# Patient Record
Sex: Female | Born: 1945 | Race: White | Hispanic: No | Smoking: Former smoker
Health system: Southern US, Community
[De-identification: ages and names within clinical notes are randomized; demographics above are authoritative.]

## PROBLEM LIST (undated history)

## (undated) DIAGNOSIS — N816 Rectocele: Secondary | ICD-10-CM

## (undated) DIAGNOSIS — Z8679 Personal history of other diseases of the circulatory system: Secondary | ICD-10-CM

## (undated) DIAGNOSIS — N815 Vaginal enterocele: Secondary | ICD-10-CM

## (undated) DIAGNOSIS — F32A Depression, unspecified: Secondary | ICD-10-CM

## (undated) DIAGNOSIS — R87629 Unspecified abnormal cytological findings in specimens from vagina: Secondary | ICD-10-CM

## (undated) DIAGNOSIS — F329 Major depressive disorder, single episode, unspecified: Secondary | ICD-10-CM

## (undated) DIAGNOSIS — I10 Essential (primary) hypertension: Secondary | ICD-10-CM

## (undated) DIAGNOSIS — IMO0002 Reserved for concepts with insufficient information to code with codable children: Secondary | ICD-10-CM

## (undated) DIAGNOSIS — N959 Unspecified menopausal and perimenopausal disorder: Secondary | ICD-10-CM

## (undated) DIAGNOSIS — Z87442 Personal history of urinary calculi: Secondary | ICD-10-CM

## (undated) DIAGNOSIS — M1990 Unspecified osteoarthritis, unspecified site: Secondary | ICD-10-CM

## (undated) DIAGNOSIS — G4700 Insomnia, unspecified: Secondary | ICD-10-CM

## (undated) HISTORY — DX: Rectocele: N81.6

## (undated) HISTORY — DX: Vaginal enterocele: N81.5

## (undated) HISTORY — DX: Reserved for concepts with insufficient information to code with codable children: IMO0002

## (undated) HISTORY — DX: Unspecified osteoarthritis, unspecified site: M19.90

## (undated) HISTORY — DX: Major depressive disorder, single episode, unspecified: F32.9

## (undated) HISTORY — PX: CEREBRAL ANEURYSM REPAIR: SHX164

## (undated) HISTORY — DX: Unspecified menopausal and perimenopausal disorder: N95.9

## (undated) HISTORY — DX: Insomnia, unspecified: G47.00

## (undated) HISTORY — DX: Depression, unspecified: F32.A

## (undated) HISTORY — PX: APPENDECTOMY: SHX54

## (undated) HISTORY — DX: Unspecified abnormal cytological findings in specimens from vagina: R87.629

---

## 1974-05-02 HISTORY — PX: ABDOMINAL HYSTERECTOMY: SHX81

## 2005-01-06 ENCOUNTER — Ambulatory Visit: Payer: Self-pay | Admitting: Internal Medicine

## 2006-08-24 ENCOUNTER — Ambulatory Visit: Payer: Self-pay | Admitting: Internal Medicine

## 2007-09-06 ENCOUNTER — Ambulatory Visit: Payer: Self-pay | Admitting: Internal Medicine

## 2007-09-19 ENCOUNTER — Ambulatory Visit: Payer: Self-pay | Admitting: Internal Medicine

## 2008-05-02 HISTORY — PX: KNEE SURGERY: SHX244

## 2008-10-22 ENCOUNTER — Ambulatory Visit: Payer: Self-pay | Admitting: Internal Medicine

## 2009-06-24 ENCOUNTER — Ambulatory Visit: Payer: Self-pay | Admitting: Gastroenterology

## 2010-03-31 ENCOUNTER — Ambulatory Visit: Payer: Self-pay | Admitting: Podiatry

## 2010-08-26 ENCOUNTER — Ambulatory Visit: Payer: Self-pay | Admitting: Family Medicine

## 2012-10-31 ENCOUNTER — Ambulatory Visit: Payer: Self-pay | Admitting: Medical

## 2012-10-31 LAB — URINALYSIS, COMPLETE
Bilirubin,UR: NEGATIVE
Blood: NEGATIVE
Glucose,UR: NEGATIVE mg/dL (ref 0–75)
Ketone: NEGATIVE
Leukocyte Esterase: NEGATIVE
Nitrite: NEGATIVE
Ph: 6 (ref 4.5–8.0)
Specific Gravity: >=1.03 (ref 1.003–1.030)

## 2012-11-01 LAB — URINE CULTURE

## 2012-11-21 ENCOUNTER — Ambulatory Visit: Payer: Self-pay | Admitting: Internal Medicine

## 2012-12-04 ENCOUNTER — Ambulatory Visit: Payer: Self-pay | Admitting: Internal Medicine

## 2013-10-25 DIAGNOSIS — E782 Mixed hyperlipidemia: Secondary | ICD-10-CM | POA: Insufficient documentation

## 2013-10-25 DIAGNOSIS — G4700 Insomnia, unspecified: Secondary | ICD-10-CM | POA: Insufficient documentation

## 2013-10-25 DIAGNOSIS — R55 Syncope and collapse: Secondary | ICD-10-CM | POA: Insufficient documentation

## 2013-10-25 DIAGNOSIS — I340 Nonrheumatic mitral (valve) insufficiency: Secondary | ICD-10-CM | POA: Insufficient documentation

## 2013-10-25 DIAGNOSIS — I10 Essential (primary) hypertension: Secondary | ICD-10-CM | POA: Insufficient documentation

## 2013-12-16 ENCOUNTER — Ambulatory Visit: Payer: Self-pay | Admitting: Internal Medicine

## 2013-12-17 ENCOUNTER — Ambulatory Visit: Payer: Self-pay | Admitting: Internal Medicine

## 2014-05-02 HISTORY — PX: ENTEROCELE REPAIR: SHX623

## 2014-05-08 ENCOUNTER — Ambulatory Visit: Payer: Self-pay | Admitting: Obstetrics and Gynecology

## 2014-05-08 LAB — BASIC METABOLIC PANEL
ANION GAP: 7 (ref 7–16)
BUN: 12 mg/dL (ref 7–18)
Calcium, Total: 8.9 mg/dL (ref 8.5–10.1)
Chloride: 102 mmol/L (ref 98–107)
Co2: 29 mmol/L (ref 21–32)
Creatinine: 0.63 mg/dL (ref 0.60–1.30)
EGFR (African American): >60
EGFR (Non-African Amer.): >60
Glucose: 78 mg/dL (ref 65–99)
OSMOLALITY: 274 (ref 275–301)
Potassium: 3.6 mmol/L (ref 3.5–5.1)
Sodium: 138 mmol/L (ref 136–145)

## 2014-05-08 LAB — CBC
HCT: 39.2 % (ref 35.0–47.0)
HGB: 12.8 g/dL (ref 12.0–16.0)
MCH: 31.9 pg (ref 26.0–34.0)
MCHC: 32.6 g/dL (ref 32.0–36.0)
MCV: 98 fL (ref 80–100)
Platelet: 296 10*3/uL (ref 150–440)
RBC: 4.01 10*6/uL (ref 3.80–5.20)
RDW: 13.3 % (ref 11.5–14.5)
WBC: 9.8 10*3/uL (ref 3.6–11.0)

## 2014-05-12 ENCOUNTER — Ambulatory Visit: Payer: Self-pay | Admitting: Obstetrics and Gynecology

## 2014-05-12 HISTORY — PX: ANTERIOR AND POSTERIOR VAGINAL REPAIR: SUR5

## 2014-05-13 LAB — CREATININE, SERUM
CREATININE: 0.62 mg/dL (ref 0.60–1.30)
EGFR (African American): >60
EGFR (Non-African Amer.): >60

## 2014-05-13 LAB — HEMOGLOBIN: HGB: 10.5 g/dL — AB (ref 12.0–16.0)

## 2014-06-20 ENCOUNTER — Ambulatory Visit: Payer: Self-pay | Admitting: Internal Medicine

## 2014-08-21 ENCOUNTER — Ambulatory Visit
Admit: 2014-08-21 | Disposition: A | Discharge status: Home / Self Care | Payer: Self-pay | Attending: Internal Medicine | Admitting: Internal Medicine

## 2014-08-31 NOTE — Op Note (Signed)
PATIENT NAME:  Katrina MachoSHOFFNER, Hanifah MR#:  [id number] DATE OF BIRTH:  11-25-1945  DATE OF PROCEDURE:  05/12/2014  PREOPERATIVE DIAGNOSIS:  Pelvic organ prolapse.   POSTOPERATIVE DIAGNOSES:  1.  Large enterocele.  2.  Large rectocele.   SURGICAL PROCEDURE: Posterior colporrhaphy with enterocele ligation.   SURGEON: Sharon SellerMartin Xyla Leisner, MD.    FIRST ASSISTANT: Hildred LaserAnika Cherry, MD.    SECOND ASSISTANT: Verdia KubaJennifer Beard, PA-S.   ANESTHESIA: General LMA.   INDICATIONS: The patient is a [age] year old white female, status post TAH/BSO in the past, using a pessary for outpatient management of pelvic organ prolapse, presents for definitive surgery.   FINDINGS AT SURGERY: Revealed a large enterocele and a large rectocele. First degree cystocele was also identified.   DESCRIPTION OF PROCEDURE: The patient was brought to the operating room where she was placed in the supine position. General anesthesia was induced without difficulty. LMA technique was used. A Betadine perineal and intravaginal prep and drape was performed in the standard fashion. A Foley catheter was placed and was draining clear yellow urine from the bladder. The pelvic organ prolapse repair was then done in standard fashion. Two Allis clamps were used to grasp the lateral margins at the introitus. A diamond-shaped wedge of tissue was excised from the perineum and the introitus. Following removal of this scar the vaginal mucosa was undermined with Metzenbaum scissors and incised in the midline. Allis-Adair retractors were used to facilitate exposure. The enterocele sac was then eventually entered sharply. The perirectal fascia was dissected off the vagina through sharp and blunt dissection. The excess vagina and enterocele sac were excised sharply at the apex of the vagina. Pursestring closure was performed using 0 Vicryl suture in a simple running manner. The posterior colporrhaphy was then performed with plication of the levator complexes  bilaterally using horizontal mattress sutures of 0 Vicryl. Excess vaginal mucosa was trimmed. The vagina was reapproximated using simple interrupted sutures of 2-0 Vicryl. Upon completion of the procedure the vagina was packed with Kerlix with Premarin cream. The patient was then awakened, mobilized, and taken to the recovery room in satisfactory condition.   ESTIMATED BLOOD LOSS: 50 mL.   IV FLUIDS: 1100 mL.   URINE OUTPUT: 200 mL.   All instruments, needle, and sponge counts were verified as correct.    ____________________________ Prentice DockerMartin A. Donshay Lupinski, MD mad:bu D: 05/15/2014 12:42:12 ET T: 05/15/2014 18:38:31 ET JOB#: [id number]  cc: Daphine DeutscherMartin A. Tobenna Needs, MD, <Dictator> [facility] Prentice DockerMARTIN A Gilda Abboud MD ELECTRONICALLY SIGNED 05/26/2014 0:48

## 2014-10-23 ENCOUNTER — Telehealth: Payer: Self-pay | Admitting: Obstetrics and Gynecology

## 2014-10-23 NOTE — Telephone Encounter (Signed)
PT NEEDS REFILL ON PREMARIN CREAM  SENT TO [facility] [name]

## 2014-10-24 ENCOUNTER — Other Ambulatory Visit: Payer: Self-pay | Admitting: Internal Medicine

## 2014-10-24 DIAGNOSIS — Z1239 Encounter for other screening for malignant neoplasm of breast: Secondary | ICD-10-CM

## 2014-10-24 DIAGNOSIS — N6320 Unspecified lump in the left breast, unspecified quadrant: Secondary | ICD-10-CM

## 2014-10-24 NOTE — Telephone Encounter (Signed)
Pt wants refill of premarin cream. I don't see in [employer] where we gave her any. Pt aware I will ask mad and call her back.

## 2014-10-28 MED ORDER — ESTROGENS, CONJUGATED 0.625 MG/GM VA CREA: 1.0000 | TOPICAL_CREAM | Freq: Every day | VAGINAL | Status: DC

## 2014-10-28 NOTE — Telephone Encounter (Signed)
PT AWARE PER [facility] ERX.

## 2014-11-21 ENCOUNTER — Other Ambulatory Visit: Payer: Self-pay | Admitting: Internal Medicine

## 2014-11-21 DIAGNOSIS — R1084 Generalized abdominal pain: Secondary | ICD-10-CM

## 2014-11-26 ENCOUNTER — Other Ambulatory Visit: Payer: Self-pay | Admitting: Internal Medicine

## 2014-11-26 ENCOUNTER — Ambulatory Visit
Admission: RE | Admit: 2014-11-26 | Discharge: 2014-11-26 | Disposition: A | Discharge status: Home / Self Care | Payer: Medicare Other | Source: Ambulatory Visit | Attending: Internal Medicine | Admitting: Internal Medicine

## 2014-11-26 DIAGNOSIS — K838 Other specified diseases of biliary tract: Secondary | ICD-10-CM | POA: Diagnosis not present

## 2014-11-26 DIAGNOSIS — R1084 Generalized abdominal pain: Secondary | ICD-10-CM

## 2014-11-26 DIAGNOSIS — R102 Pelvic and perineal pain: Secondary | ICD-10-CM

## 2014-11-26 DIAGNOSIS — R1030 Lower abdominal pain, unspecified: Secondary | ICD-10-CM

## 2014-12-01 ENCOUNTER — Ambulatory Visit
Admission: RE | Admit: 2014-12-01 | Discharge: 2014-12-01 | Disposition: A | Discharge status: Home / Self Care | Payer: Medicare Other | Source: Ambulatory Visit | Attending: Internal Medicine | Admitting: Internal Medicine

## 2014-12-01 DIAGNOSIS — R1030 Lower abdominal pain, unspecified: Secondary | ICD-10-CM

## 2014-12-01 DIAGNOSIS — R102 Pelvic and perineal pain: Secondary | ICD-10-CM

## 2014-12-02 ENCOUNTER — Ambulatory Visit
Admission: RE | Admit: 2014-12-02 | Discharge: 2014-12-02 | Disposition: A | Discharge status: Home / Self Care | Payer: Medicare Other | Source: Ambulatory Visit | Attending: Internal Medicine | Admitting: Internal Medicine

## 2014-12-02 DIAGNOSIS — K760 Fatty (change of) liver, not elsewhere classified: Secondary | ICD-10-CM | POA: Insufficient documentation

## 2014-12-02 DIAGNOSIS — I700 Atherosclerosis of aorta: Secondary | ICD-10-CM | POA: Insufficient documentation

## 2014-12-02 DIAGNOSIS — K5730 Diverticulosis of large intestine without perforation or abscess without bleeding: Secondary | ICD-10-CM | POA: Diagnosis not present

## 2014-12-02 DIAGNOSIS — R1030 Lower abdominal pain, unspecified: Secondary | ICD-10-CM | POA: Diagnosis present

## 2014-12-02 DIAGNOSIS — M5134 Other intervertebral disc degeneration, thoracic region: Secondary | ICD-10-CM | POA: Insufficient documentation

## 2014-12-02 DIAGNOSIS — R102 Pelvic and perineal pain: Secondary | ICD-10-CM | POA: Diagnosis present

## 2014-12-02 HISTORY — DX: Essential (primary) hypertension: I10

## 2014-12-02 MED ORDER — IOHEXOL 300 MG/ML  SOLN
100.0000 mL | Freq: Once | INTRAMUSCULAR | Status: AC | PRN
  Administered 2014-12-02: 100 mL via INTRAVENOUS

## 2014-12-18 ENCOUNTER — Ambulatory Visit
Admission: RE | Admit: 2014-12-18 | Discharge: 2014-12-18 | Disposition: A | Discharge status: Home / Self Care | Payer: Medicare Other | Source: Ambulatory Visit | Attending: Internal Medicine | Admitting: Internal Medicine

## 2014-12-18 ENCOUNTER — Other Ambulatory Visit: Payer: Self-pay | Admitting: Internal Medicine

## 2014-12-18 ENCOUNTER — Ambulatory Visit: Payer: Medicare Other

## 2014-12-18 DIAGNOSIS — N63 Unspecified lump in breast: Secondary | ICD-10-CM | POA: Diagnosis present

## 2014-12-18 DIAGNOSIS — N6320 Unspecified lump in the left breast, unspecified quadrant: Secondary | ICD-10-CM

## 2014-12-18 DIAGNOSIS — Z1239 Encounter for other screening for malignant neoplasm of breast: Secondary | ICD-10-CM

## 2014-12-23 ENCOUNTER — Encounter: Payer: Self-pay | Admitting: Obstetrics and Gynecology

## 2014-12-23 ENCOUNTER — Ambulatory Visit (INDEPENDENT_AMBULATORY_CARE_PROVIDER_SITE_OTHER): Payer: Medicare Other | Admitting: Obstetrics and Gynecology

## 2014-12-23 VITALS — BP 144/77 | HR 65 | Ht 62.0 in | Wt 123.8 lb

## 2014-12-23 DIAGNOSIS — N819 Female genital prolapse, unspecified: Secondary | ICD-10-CM | POA: Diagnosis not present

## 2014-12-23 DIAGNOSIS — R0789 Other chest pain: Secondary | ICD-10-CM | POA: Insufficient documentation

## 2014-12-23 DIAGNOSIS — F329 Major depressive disorder, single episode, unspecified: Secondary | ICD-10-CM | POA: Insufficient documentation

## 2014-12-23 DIAGNOSIS — Z780 Asymptomatic menopausal state: Secondary | ICD-10-CM | POA: Diagnosis not present

## 2014-12-23 DIAGNOSIS — F32A Depression, unspecified: Secondary | ICD-10-CM | POA: Insufficient documentation

## 2014-12-23 DIAGNOSIS — N200 Calculus of kidney: Secondary | ICD-10-CM | POA: Insufficient documentation

## 2014-12-23 NOTE — Patient Instructions (Signed)
1.  Estrace cream intravaginally biweekly. 2.  Return in 1 year for follow-up. 3.  Avoid chronic heavy lifting.

## 2014-12-23 NOTE — Progress Notes (Signed)
Patient ID: Katrina Brown, female   DOB: [birth year], 69 y.o.   MRN: [id number] 6 month f/u from s/p a/p repair with enterocele ligation  Chief complaint: 1.Follow-up on  Pelvic organ prolapse.  Patient is 6 months status post anterior and posterior colporrhaphy with enterocele ligation.  She is doing well with normal bowel and bladder function.  She is not having any incontinence.  She is not having any pelvic pressure symptoms. Patient is retired at this time and is not doing heavy lifting.  However, she is borderline like to return to the workplace.  I have encouraged her to consider that except for heavy lifting.  Past Medical History  Diagnosis Date  . Hypertension   . Arthritis   . Depression   . MVP (mitral valve prolapse)   . Vaginal Pap smear, abnormal   . Insomnia   . Renal lithiasis   . Cystocele   . Rectocele   . Vaginal enterocele   . Menopausal and perimenopausal disorder    Past Surgical History  Procedure Laterality Date  . Anterior and posterior vaginal repair  05/12/2014  . Enterocele repair  05/2014  . Abdominal hysterectomy  1976    tah/bso  . Appendectomy    . Knee surgery      OBJECTIVE: BP 144/77 mmHg  Pulse 65  Ht  (1.575 m)  Wt 123 lb 12.8 oz (56.155 kg)  BMI 22.64 kg/m2 Physical vulvar and white female in no acute distress. Abdomen soft, nontender. Pelvic exam: External genitalia normal. BUS normal. Vagina with good effect and good vault support. Cervix surgically absent. Uterus surgically absent. Bimanual exam reveals excellent vault support. Rectovaginal not done.  IMPRESSION: 1.  Status post A&P repair with enterocele ligation 6 months ago, doing well.  PLAN: 1.  Recommend continuing Estrace cream, intravaginal biweekly. 2.  Return in 1 year for follow-up or when necessary.  A total of 15 minutes were spent face-to-face with the patient during this encounter and over half of that time dealt with counseling and coordination of  care.  Herold Harms, MD

## 2015-02-23 ENCOUNTER — Other Ambulatory Visit: Payer: Self-pay

## 2015-02-23 MED ORDER — ESTRADIOL 1 MG PO TABS: 1.0000 mg | ORAL_TABLET | Freq: Every day | ORAL | Status: DC

## 2015-12-29 ENCOUNTER — Ambulatory Visit: Payer: Medicare Other | Admitting: Obstetrics and Gynecology

## 2016-01-10 IMAGING — MR MRI HEAD WITHOUT CONTRAST
10 series · 48 of 48 positions shown · non-contrast
Comparison: CT head 06/20/2014

CLINICAL DATA: Visual change.  Double vision.

EXAM:
MRI HEAD WITHOUT CONTRAST
TECHNIQUE: Multiplanar, multiecho pulse sequences of the brain and surrounding
structures were obtained without intravenous contrast.

[Series 2: T1 · sagittal · 5.0mm · 0.45mm/px · 3 of 29 slices shown (1 of 2)]
[im 1/29]
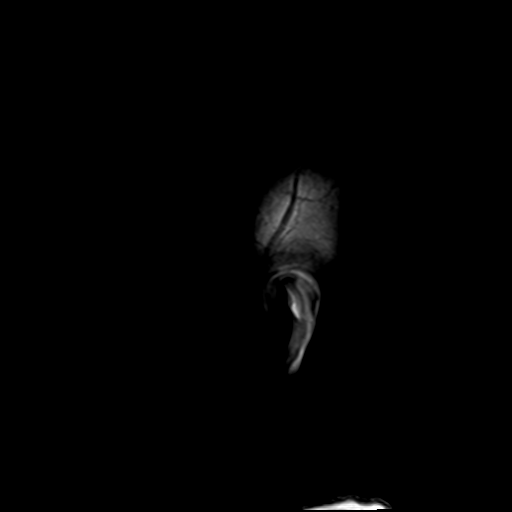
[im 15/29]
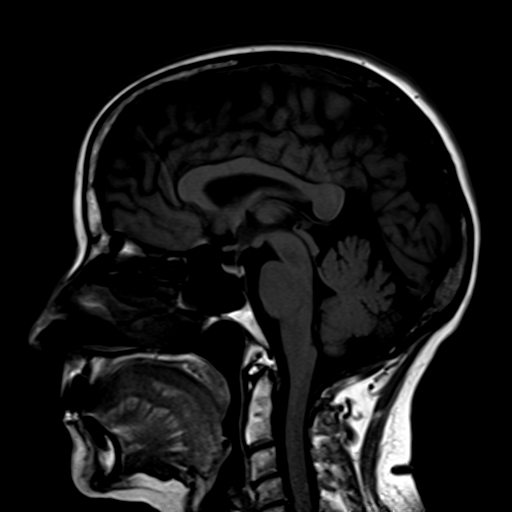
[im 29/29]
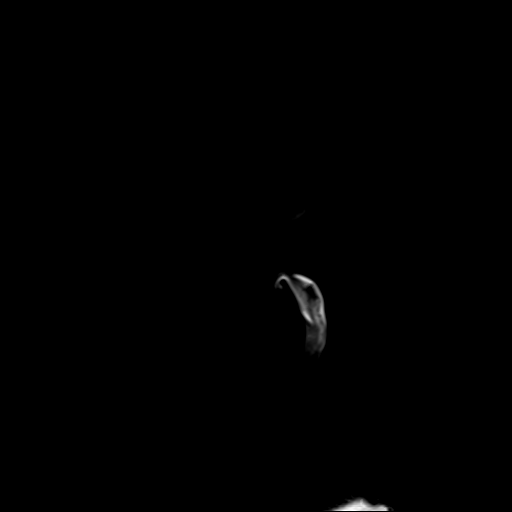

[Series 4: DWI · axial · 3.0mm · 1.80mm/px · z∈[-100,+62]mm · 7 of 54 slices shown (1 of 4)]
[im 1/54]
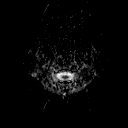
[im 9/54]
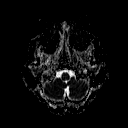
[im 18/54]
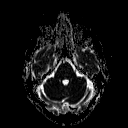
[im 27/54]
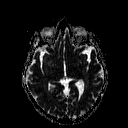
[im 36/54]
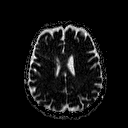
[im 45/54]
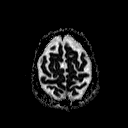
[im 54/54]
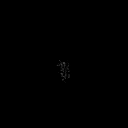

[Series 6: DWI · coronal · 3.0mm · 1.80mm/px · 6 of 48 slices shown (2 of 4)]
[im 1/48]
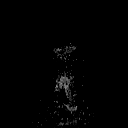
[im 10/48]
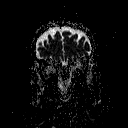
[im 19/48]
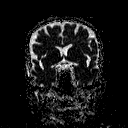
[im 29/48]
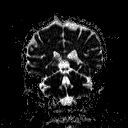
[im 38/48]
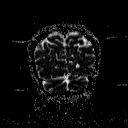
[im 48/48]
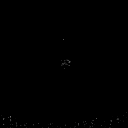

[Series 7: T2 · axial · 5.0mm · 0.60mm/px · z∈[-99,+63]mm · 3 of 26 slices shown (1 of 3)]
[im 1/26]
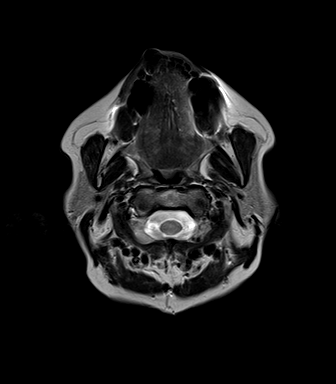
[im 13/26]
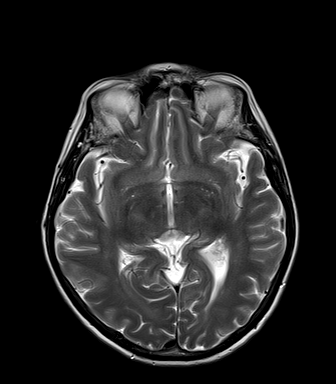
[im 26/26]
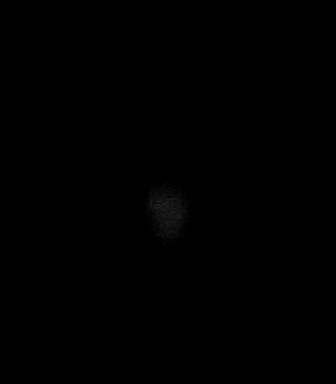

[Series 8: FLAIR · axial · 5.0mm · 0.45mm/px · z∈[-100,+62]mm · 3 of 26 slices shown]
[im 1/26]
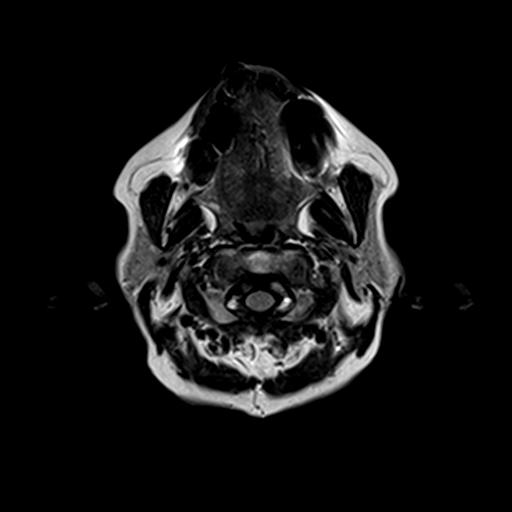
[im 13/26]
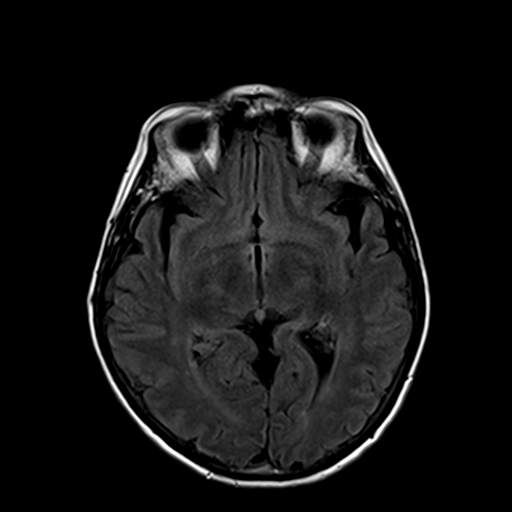
[im 26/26]
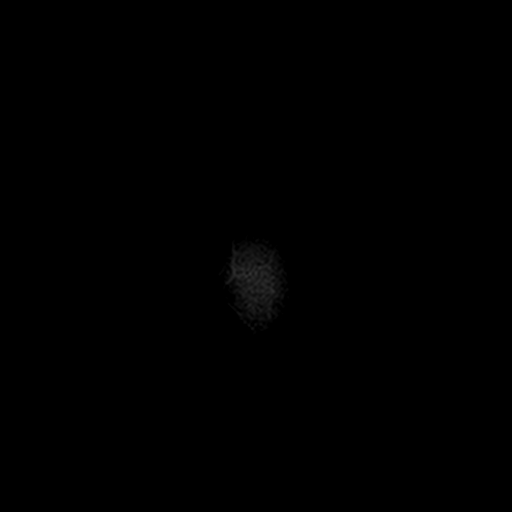

[Series 9: T2 · axial · 5.0mm · 0.45mm/px · z∈[-99,+63]mm · 3 of 26 slices shown (2 of 3)]
[im 1/26]
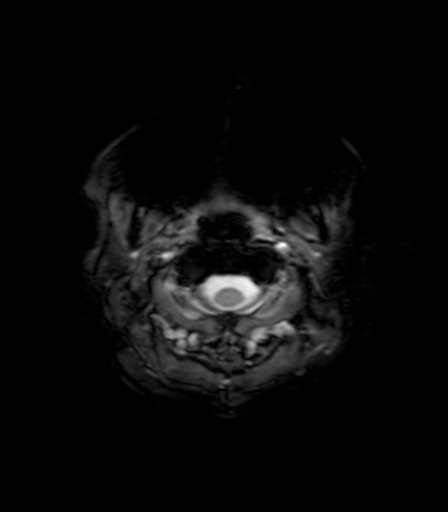
[im 13/26]
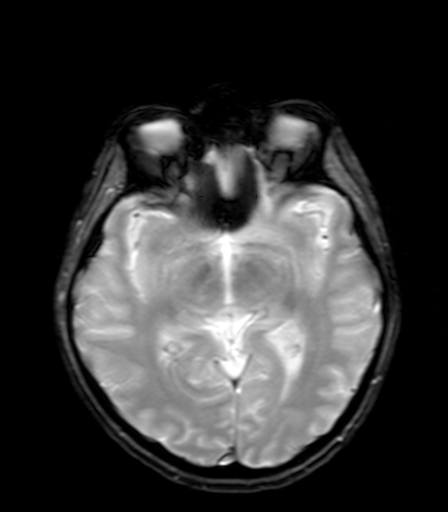
[im 26/26]
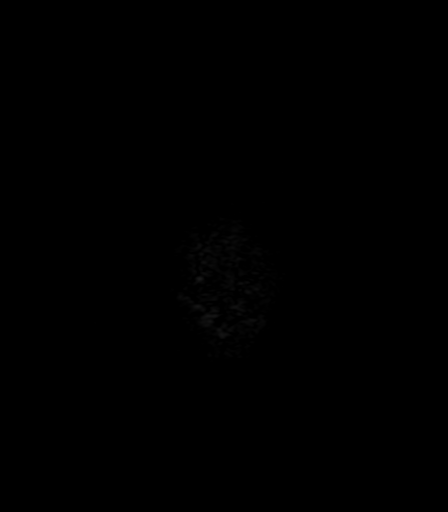

[Series 10: T1 · axial · 3.0mm · 1.00mm/px · z∈[-100,+65]mm · 7 of 56 slices shown (2 of 2)]
[im 1/56]
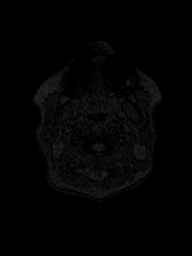
[im 10/56]
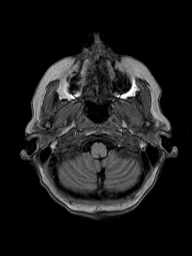
[im 19/56]
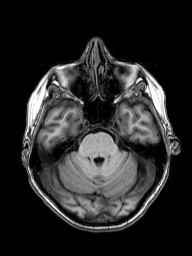
[im 28/56]
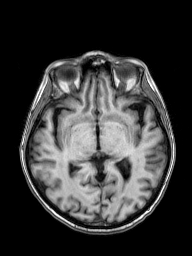
[im 37/56]
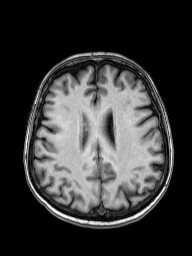
[im 46/56]
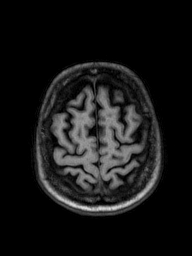
[im 56/56]
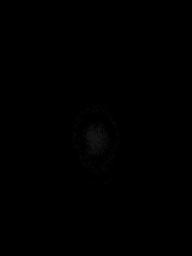

[Series 11: T2 · coronal · 5.0mm · 0.49mm/px · 4 of 29 slices shown (3 of 3)]
[im 1/29]
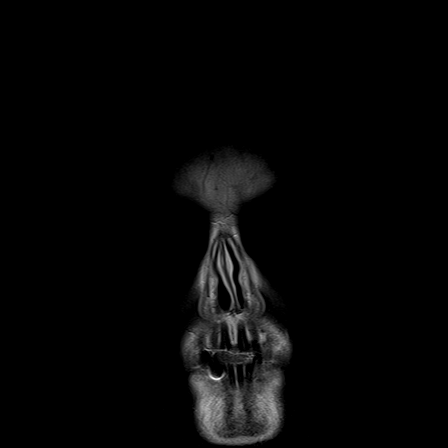
[im 10/29]
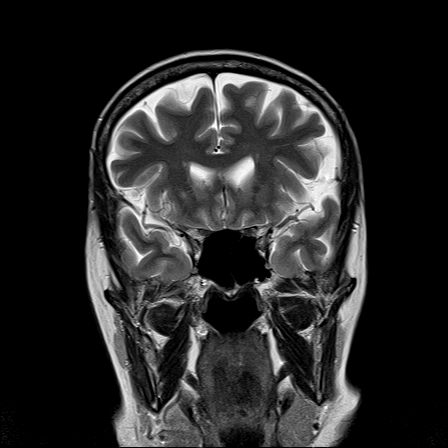
[im 19/29]
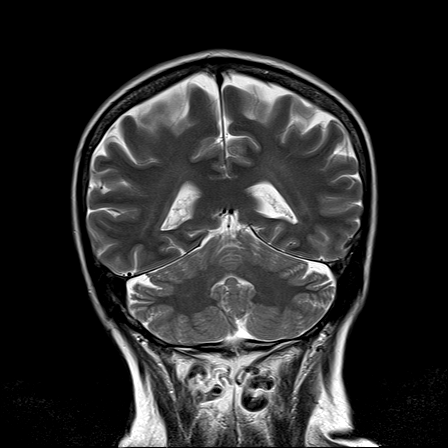
[im 29/29]
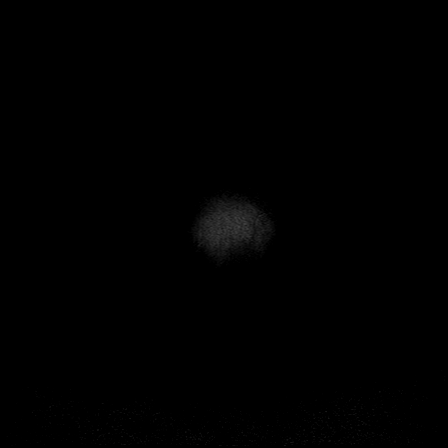

[Series 100: DWI · axial · 3.0mm · 1.80mm/px · z∈[-97,+62]mm · 6 of 51 slices shown (3 of 4)]
[im 1/51]
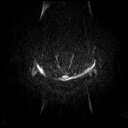
[im 11/51]
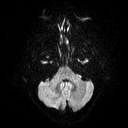
[im 21/51]
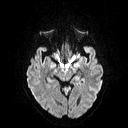
[im 31/51]
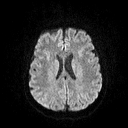
[im 41/51]
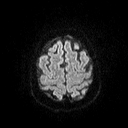
[im 51/51]
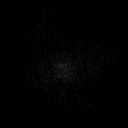

[Series 101: DWI · coronal · 3.0mm · 1.80mm/px · 6 of 48 slices shown (4 of 4)]
[im 1/48]
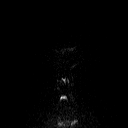
[im 10/48]
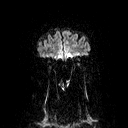
[im 19/48]
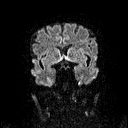
[im 29/48]
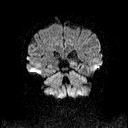
[im 38/48]
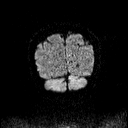
[im 48/48]
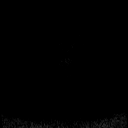

[48 of 48 positions shown; findings below may reference images not displayed]

FINDINGS: Ventricle size is normal. Cerebral volume normal for age. Pituitary
normal in size. Craniocervical junction normal.

Negative for acute infarct. Several small white matter
hyperintensities bilaterally, typical for age and likely related to
microvascular chronic ischemia. No cortical infarct. Small
hyperintensity in the central pons likely related to chronic
ischemia.

Negative for intracranial hemorrhage

Negative for mass or edema.  No shift of the midline structures.

Paranasal sinuses are clear.
IMPRESSION: No acute abnormality. Minimal chronic microvascular ischemic
changes, less than expected for age.

## 2016-01-21 ENCOUNTER — Ambulatory Visit (INDEPENDENT_AMBULATORY_CARE_PROVIDER_SITE_OTHER): Payer: Medicare Other | Admitting: Obstetrics and Gynecology

## 2016-01-21 ENCOUNTER — Encounter: Payer: Self-pay | Admitting: Obstetrics and Gynecology

## 2016-01-21 VITALS — BP 147/76 | HR 69 | Ht 62.0 in | Wt 131.3 lb

## 2016-01-21 DIAGNOSIS — IMO0002 Reserved for concepts with insufficient information to code with codable children: Principal | ICD-10-CM

## 2016-01-21 DIAGNOSIS — IMO0001 Reserved for inherently not codable concepts without codable children: Secondary | ICD-10-CM

## 2016-01-21 DIAGNOSIS — N8110 Cystocele, unspecified: Secondary | ICD-10-CM | POA: Diagnosis not present

## 2016-01-21 NOTE — Progress Notes (Signed)
Chief complaint: 1. Cystocele 2. 18 months status post anterior posterior colporrhaphy with enterocele ligation  Patient is now 18 months status post surgery. She is asymptomatic. Bowel bladder function are normal. She is taking Estrace 1 mg daily. She is not using any estrogen vaginal cream  Past medical history, past surgical history, problem list, medications, and allergies are reviewed  OBJECTIVE: BP (!) 147/76   Pulse 69   Ht 5\' 2"  (1.575 m)   Wt 131 lb 4.8 oz (59.6 kg)   BMI 24.02 kg/m  Pleasant white female in no acute distress Abdomen: Soft, nontender Pelvic exam: External genitalia-normal BUS-normal Vagina-second-degree cystocele is noted; there is good support at the urethrovesical angle; no enterocele; mild rectocele Cervix-surgically absent Uterus surgically absent Adnexa-nonpalpable nontender Rectovaginal-normal external exam; normal sphincter tone; no rectal masses  ASSESSMENT: 1. Second degree cystocele, asymptomatic 2. 18 months status post anterior posterior colporrhaphy with enterocele ligation  PLAN: 1. Continue with Estrace 1 mg daily 2. Limited activity to moderate lifting 3. Return in 1 year  A total of 15 minutes were spent face-to-face with the patient during this encounter and over half of that time dealt with counseling and coordination of care.  Herold HarmsMartin A Oney Folz, MD  Note: This dictation was prepared with Dragon dictation along with smaller phrase technology. Any transcriptional errors that result from this process are unintentional.

## 2016-01-21 NOTE — Patient Instructions (Signed)
1. Okay to perform moderate lifting 2. Return in 1 year for follow-up 3. Continue with Estrace 1 mg daily

## 2016-01-28 ENCOUNTER — Other Ambulatory Visit: Payer: Self-pay | Admitting: Internal Medicine

## 2016-01-28 DIAGNOSIS — N63 Unspecified lump in unspecified breast: Secondary | ICD-10-CM

## 2016-02-15 ENCOUNTER — Ambulatory Visit
Admission: RE | Admit: 2016-02-15 | Discharge: 2016-02-15 | Disposition: A | Discharge status: Home / Self Care | Payer: Medicare Other | Source: Ambulatory Visit | Attending: Internal Medicine | Admitting: Internal Medicine

## 2016-02-15 DIAGNOSIS — N63 Unspecified lump in unspecified breast: Secondary | ICD-10-CM | POA: Diagnosis present

## 2016-02-15 DIAGNOSIS — N6002 Solitary cyst of left breast: Secondary | ICD-10-CM | POA: Insufficient documentation

## 2016-04-21 ENCOUNTER — Other Ambulatory Visit: Payer: Self-pay | Admitting: Orthopedic Surgery

## 2016-04-21 DIAGNOSIS — G8929 Other chronic pain: Secondary | ICD-10-CM

## 2016-04-21 DIAGNOSIS — M25562 Pain in left knee: Principal | ICD-10-CM

## 2016-05-03 ENCOUNTER — Ambulatory Visit
Admission: RE | Admit: 2016-05-03 | Discharge: 2016-05-03 | Disposition: A | Discharge status: Home / Self Care | Payer: Medicare Other | Source: Ambulatory Visit | Attending: Orthopedic Surgery | Admitting: Orthopedic Surgery

## 2016-05-03 ENCOUNTER — Encounter: Payer: Self-pay | Admitting: Radiology

## 2016-05-03 DIAGNOSIS — G8929 Other chronic pain: Secondary | ICD-10-CM

## 2016-05-03 DIAGNOSIS — X58XXXA Exposure to other specified factors, initial encounter: Secondary | ICD-10-CM | POA: Insufficient documentation

## 2016-05-03 DIAGNOSIS — M25562 Pain in left knee: Secondary | ICD-10-CM | POA: Diagnosis present

## 2016-05-03 DIAGNOSIS — M7122 Synovial cyst of popliteal space [Baker], left knee: Secondary | ICD-10-CM | POA: Diagnosis not present

## 2016-05-03 DIAGNOSIS — S83232A Complex tear of medial meniscus, current injury, left knee, initial encounter: Secondary | ICD-10-CM | POA: Diagnosis not present

## 2016-05-17 ENCOUNTER — Other Ambulatory Visit: Payer: Self-pay | Admitting: Orthopedic Surgery

## 2016-06-20 NOTE — Patient Instructions (Signed)
  Your procedure is scheduled on: 06-27-16 [facility](MONDAY) Report to Same Day Surgery 2nd floor medical mall [facility](Medical Mall Entrance-take elevator on left to 2nd floor.  Check in with surgery information desk.) To find out your arrival time please call [phone] between 1PM - 3PM on 06-24-16 (FRIDAY)  Remember: Instructions that are not followed completely may result in serious medical risk, up to and including death, or upon the discretion of your surgeon and anesthesiologist your surgery may need to be rescheduled.    _x___ 1. Do not eat food or drink liquids after midnight. No gum chewing or hard candies.     __x__ 2. No Alcohol for 24 hours before or after surgery.   __x__3. No Smoking for 24 prior to surgery.   ____  4. Bring all medications with you on the day of surgery if instructed.    __x__ 5. Notify your doctor if there is any change in your medical condition     (cold, fever, infections).     Do not wear jewelry, make-up, hairpins, clips or nail polish.  Do not wear lotions, powders, or perfumes. You may wear deodorant.  Do not shave 48 hours prior to surgery. Men may shave face and neck.  Do not bring valuables to the hospital.    [facility] any belongings or valuables.               Contacts, dentures or bridgework may not be worn into surgery.  Leave your suitcase in the car. After surgery it may be brought to your room.  For patients admitted to the hospital, discharge time is determined by your treatment team.   Patients discharged the day of surgery will not be allowed to drive home.  You will need someone to drive you home and stay with you the night of your procedure.    Please read over the following fact sheets that you were given:   [facility] Preparing for [facility] and or MRSA Information   _x___ Take these medicines the morning of surgery with A SIP OF WATER:    1. EFFEXOR  2.   3.  4.  5.  6.  ____Fleets enema or Magnesium Citrate  as directed.   _x___ Use CHG Soap or sage wipes as directed on instruction sheet   ____ Use inhalers on the day of surgery and bring to hospital day of surgery  ____ Stop metformin 2 days prior to surgery    ____ Take 1/2 of usual insulin dose the night before surgery and none on the morning of  surgery.   _X___ Stop Aspirin, Coumadin, Pllavix ,Eliquis, Effient, or Pradaxa-STOP ASPIRIN NOW  x__ Stop Anti-inflammatories such as Advil, Aleve, Ibuprofen, Motrin, Naproxen,          Naprosyn, Goodies powders or aspirin products NOW-Ok to take Tylenol.   _X___ Stop supplements until after surgery-STOP BIOTIN, TURMERIC AND FISH OIL NOW   ____ Bring C-Pap to the hospital.

## 2016-06-21 ENCOUNTER — Encounter
Admission: RE | Admit: 2016-06-21 | Discharge: 2016-06-21 | Disposition: A | Discharge status: Home / Self Care | Payer: Medicare Other | Source: Ambulatory Visit | Attending: Orthopedic Surgery | Admitting: Orthopedic Surgery

## 2016-06-21 DIAGNOSIS — Z01812 Encounter for preprocedural laboratory examination: Secondary | ICD-10-CM | POA: Insufficient documentation

## 2016-06-21 DIAGNOSIS — R001 Bradycardia, unspecified: Secondary | ICD-10-CM | POA: Insufficient documentation

## 2016-06-21 DIAGNOSIS — R9431 Abnormal electrocardiogram [ECG] [EKG]: Secondary | ICD-10-CM | POA: Diagnosis not present

## 2016-06-21 DIAGNOSIS — I10 Essential (primary) hypertension: Secondary | ICD-10-CM | POA: Diagnosis not present

## 2016-06-21 DIAGNOSIS — Z01810 Encounter for preprocedural cardiovascular examination: Secondary | ICD-10-CM | POA: Insufficient documentation

## 2016-06-21 HISTORY — DX: Personal history of urinary calculi: Z87.442

## 2016-06-21 HISTORY — DX: Personal history of other diseases of the circulatory system: Z86.79

## 2016-06-21 LAB — CBC
HCT: 35.5 % (ref 35.0–47.0)
HEMOGLOBIN: 12.8 g/dL (ref 12.0–16.0)
MCH: 33.2 pg (ref 26.0–34.0)
MCHC: 35.9 g/dL (ref 32.0–36.0)
MCV: 92.5 fL (ref 80.0–100.0)
Platelets: 299 10*3/uL (ref 150–440)
RBC: 3.84 MIL/uL (ref 3.80–5.20)
RDW: 13.1 % (ref 11.5–14.5)
WBC: 8.6 10*3/uL (ref 3.6–11.0)

## 2016-06-21 LAB — POTASSIUM: POTASSIUM: 4.1 mmol/L (ref 3.5–5.1)

## 2016-06-26 MED ORDER — CLINDAMYCIN PHOSPHATE 900 MG/50ML IV SOLN
900.0000 mg | INTRAVENOUS | Status: AC
  Administered 2016-06-27: 900 mg via INTRAVENOUS

## 2016-06-27 ENCOUNTER — Ambulatory Visit: Payer: Medicare Other | Admitting: Anesthesiology

## 2016-06-27 ENCOUNTER — Encounter
Admission: RE | Disposition: A | Discharge status: Home / Self Care | Payer: Self-pay | Source: Ambulatory Visit | Attending: Orthopedic Surgery

## 2016-06-27 ENCOUNTER — Encounter: Payer: Self-pay | Admitting: *Deleted

## 2016-06-27 ENCOUNTER — Ambulatory Visit
Admission: RE | Admit: 2016-06-27 | Discharge: 2016-06-27 | Disposition: A | Discharge status: Home / Self Care | Payer: Medicare Other | Source: Ambulatory Visit | Attending: Orthopedic Surgery | Admitting: Orthopedic Surgery

## 2016-06-27 DIAGNOSIS — M23222 Derangement of posterior horn of medial meniscus due to old tear or injury, left knee: Secondary | ICD-10-CM | POA: Insufficient documentation

## 2016-06-27 DIAGNOSIS — I10 Essential (primary) hypertension: Secondary | ICD-10-CM | POA: Diagnosis not present

## 2016-06-27 DIAGNOSIS — Z880 Allergy status to penicillin: Secondary | ICD-10-CM | POA: Diagnosis not present

## 2016-06-27 DIAGNOSIS — M7122 Synovial cyst of popliteal space [Baker], left knee: Secondary | ICD-10-CM | POA: Diagnosis not present

## 2016-06-27 DIAGNOSIS — M94262 Chondromalacia, left knee: Secondary | ICD-10-CM | POA: Diagnosis not present

## 2016-06-27 DIAGNOSIS — Z7982 Long term (current) use of aspirin: Secondary | ICD-10-CM | POA: Insufficient documentation

## 2016-06-27 DIAGNOSIS — F4321 Adjustment disorder with depressed mood: Secondary | ICD-10-CM | POA: Diagnosis not present

## 2016-06-27 DIAGNOSIS — M25462 Effusion, left knee: Secondary | ICD-10-CM | POA: Diagnosis not present

## 2016-06-27 DIAGNOSIS — F5104 Psychophysiologic insomnia: Secondary | ICD-10-CM | POA: Diagnosis not present

## 2016-06-27 DIAGNOSIS — E785 Hyperlipidemia, unspecified: Secondary | ICD-10-CM | POA: Diagnosis not present

## 2016-06-27 DIAGNOSIS — M25762 Osteophyte, left knee: Secondary | ICD-10-CM | POA: Diagnosis not present

## 2016-06-27 DIAGNOSIS — M2392 Unspecified internal derangement of left knee: Secondary | ICD-10-CM | POA: Diagnosis present

## 2016-06-27 HISTORY — PX: KNEE ARTHROSCOPY: SHX127

## 2016-06-27 SURGERY — ARTHROSCOPY, KNEE
Anesthesia: General | Laterality: Left | Wound class: Clean

## 2016-06-27 MED ORDER — ACETAMINOPHEN 10 MG/ML IV SOLN
INTRAVENOUS | Status: AC
  Filled 2016-06-27: qty 100

## 2016-06-27 MED ORDER — FAMOTIDINE 20 MG PO TABS
ORAL_TABLET | ORAL | Status: AC
  Administered 2016-06-27: 20 mg via ORAL
  Filled 2016-06-27: qty 1

## 2016-06-27 MED ORDER — METOCLOPRAMIDE HCL 5 MG/ML IJ SOLN: 5.0000 mg | Freq: Three times a day (TID) | INTRAMUSCULAR | Status: DC | PRN

## 2016-06-27 MED ORDER — BISOPROLOL FUMARATE 5 MG PO TABS
5.0000 mg | ORAL_TABLET | Freq: Once | ORAL | Status: AC
  Administered 2016-06-27: 5 mg via ORAL
  Filled 2016-06-27: qty 1

## 2016-06-27 MED ORDER — BUPIVACAINE-EPINEPHRINE 0.25% -1:200000 IJ SOLN
INTRAMUSCULAR | Status: DC | PRN
  Administered 2016-06-27: 25 mL
  Administered 2016-06-27: 5 mL

## 2016-06-27 MED ORDER — FENTANYL CITRATE (PF) 100 MCG/2ML IJ SOLN: 25.0000 ug | INTRAMUSCULAR | Status: DC | PRN

## 2016-06-27 MED ORDER — PROPOFOL 10 MG/ML IV BOLUS
INTRAVENOUS | Status: AC
  Filled 2016-06-27: qty 20

## 2016-06-27 MED ORDER — LACTATED RINGERS IR SOLN
Status: DC | PRN
Start: 2016-06-27 — End: 2016-06-27
  Administered 2016-06-27: 9400 mL

## 2016-06-27 MED ORDER — FENTANYL CITRATE (PF) 100 MCG/2ML IJ SOLN
INTRAMUSCULAR | Status: DC | PRN
  Administered 2016-06-27: 50 ug via INTRAVENOUS
  Administered 2016-06-27: 12.5 ug via INTRAVENOUS
  Administered 2016-06-27: 25 ug via INTRAVENOUS

## 2016-06-27 MED ORDER — CHLORHEXIDINE GLUCONATE 4 % EX LIQD: 60.0000 mL | Freq: Once | CUTANEOUS | Status: DC

## 2016-06-27 MED ORDER — ACETAMINOPHEN 10 MG/ML IV SOLN
INTRAVENOUS | Status: DC | PRN
  Administered 2016-06-27: 1000 mg via INTRAVENOUS

## 2016-06-27 MED ORDER — LIDOCAINE HCL (CARDIAC) 20 MG/ML IV SOLN
INTRAVENOUS | Status: DC | PRN
  Administered 2016-06-27: 60 mg via INTRAVENOUS

## 2016-06-27 MED ORDER — HYDROCODONE-ACETAMINOPHEN 5-325 MG PO TABS: 1.0000 | ORAL_TABLET | ORAL | 0 refills | Status: DC | PRN

## 2016-06-27 MED ORDER — MIDAZOLAM HCL 2 MG/2ML IJ SOLN
INTRAMUSCULAR | Status: AC
  Filled 2016-06-27: qty 2

## 2016-06-27 MED ORDER — ONDANSETRON HCL 4 MG/2ML IJ SOLN
INTRAMUSCULAR | Status: AC
  Filled 2016-06-27: qty 2

## 2016-06-27 MED ORDER — PROPOFOL 10 MG/ML IV BOLUS
INTRAVENOUS | Status: DC | PRN
  Administered 2016-06-27: 20 mg via INTRAVENOUS
  Administered 2016-06-27: 120 mg via INTRAVENOUS
  Administered 2016-06-27: 80 mg via INTRAVENOUS
  Administered 2016-06-27 (×2): 30 mg via INTRAVENOUS

## 2016-06-27 MED ORDER — FAMOTIDINE 20 MG PO TABS
20.0000 mg | ORAL_TABLET | Freq: Once | ORAL | Status: AC
  Administered 2016-06-27: 20 mg via ORAL

## 2016-06-27 MED ORDER — OXYCODONE HCL 5 MG PO TABS: 5.0000 mg | ORAL_TABLET | Freq: Once | ORAL | Status: DC | PRN

## 2016-06-27 MED ORDER — MORPHINE SULFATE (PF) 4 MG/ML IV SOLN
INTRAVENOUS | Status: DC | PRN
  Administered 2016-06-27: 4 mg via INTRAVENOUS

## 2016-06-27 MED ORDER — BUPIVACAINE-EPINEPHRINE (PF) 0.25% -1:200000 IJ SOLN
INTRAMUSCULAR | Status: AC
  Filled 2016-06-27: qty 30

## 2016-06-27 MED ORDER — FENTANYL CITRATE (PF) 100 MCG/2ML IJ SOLN
INTRAMUSCULAR | Status: AC
  Filled 2016-06-27: qty 2

## 2016-06-27 MED ORDER — MORPHINE SULFATE (PF) 4 MG/ML IV SOLN
INTRAVENOUS | Status: AC
  Filled 2016-06-27: qty 1

## 2016-06-27 MED ORDER — EPHEDRINE SULFATE 50 MG/ML IJ SOLN
INTRAMUSCULAR | Status: DC | PRN
  Administered 2016-06-27 (×3): 5 mg via INTRAVENOUS

## 2016-06-27 MED ORDER — ONDANSETRON HCL 4 MG PO TABS: 4.0000 mg | ORAL_TABLET | Freq: Four times a day (QID) | ORAL | Status: DC | PRN

## 2016-06-27 MED ORDER — METOCLOPRAMIDE HCL 10 MG PO TABS: 5.0000 mg | ORAL_TABLET | Freq: Three times a day (TID) | ORAL | Status: DC | PRN

## 2016-06-27 MED ORDER — SODIUM CHLORIDE 0.9 % IV SOLN: INTRAVENOUS | Status: DC

## 2016-06-27 MED ORDER — OXYCODONE HCL 5 MG/5ML PO SOLN: 5.0000 mg | Freq: Once | ORAL | Status: DC | PRN

## 2016-06-27 MED ORDER — EPHEDRINE 5 MG/ML INJ
INTRAVENOUS | Status: AC
  Filled 2016-06-27: qty 10

## 2016-06-27 MED ORDER — HYDROCODONE-ACETAMINOPHEN 5-325 MG PO TABS: 1.0000 | ORAL_TABLET | ORAL | Status: DC | PRN

## 2016-06-27 MED ORDER — ONDANSETRON HCL 4 MG/2ML IJ SOLN: 4.0000 mg | Freq: Four times a day (QID) | INTRAMUSCULAR | Status: DC | PRN

## 2016-06-27 MED ORDER — CLINDAMYCIN PHOSPHATE 900 MG/50ML IV SOLN
INTRAVENOUS | Status: AC
  Filled 2016-06-27: qty 50

## 2016-06-27 MED ORDER — PROMETHAZINE HCL 25 MG/ML IJ SOLN: 6.2500 mg | INTRAMUSCULAR | Status: DC | PRN

## 2016-06-27 MED ORDER — ONDANSETRON HCL 4 MG/2ML IJ SOLN
INTRAMUSCULAR | Status: DC | PRN
  Administered 2016-06-27: 4 mg via INTRAVENOUS

## 2016-06-27 MED ORDER — LACTATED RINGERS IV SOLN
INTRAVENOUS | Status: DC
  Administered 2016-06-27: 50 mL/h via INTRAVENOUS

## 2016-06-27 MED ORDER — MIDAZOLAM HCL 2 MG/2ML IJ SOLN
INTRAMUSCULAR | Status: DC | PRN
  Administered 2016-06-27: 2 mg via INTRAVENOUS

## 2016-06-27 MED ORDER — LACTATED RINGERS IV SOLN
INTRAVENOUS | Status: DC | PRN
  Administered 2016-06-27 (×2): via INTRAVENOUS

## 2016-06-27 MED ORDER — MEPERIDINE HCL 50 MG/ML IJ SOLN: 6.2500 mg | INTRAMUSCULAR | Status: DC | PRN

## 2016-06-27 SURGICAL SUPPLY — 26 items
BLADE SHAVER 4.5 DBL SERAT CV (CUTTER) ×3 IMPLANT
BNDG ESMARK 6X12 TAN STRL LF (GAUZE/BANDAGES/DRESSINGS) IMPLANT
CUFF TOURN 24 STER (MISCELLANEOUS) ×3 IMPLANT
CUFF TOURN 30 STER DUAL PORT (MISCELLANEOUS) IMPLANT
DRSG DERMACEA 8X12 NADH (GAUZE/BANDAGES/DRESSINGS) ×3 IMPLANT
DURAPREP 26ML APPLICATOR (WOUND CARE) ×6 IMPLANT
GAUZE SPONGE 4X4 12PLY STRL (GAUZE/BANDAGES/DRESSINGS) ×3 IMPLANT
GLOVE BIOGEL M STRL SZ7.5 (GLOVE) ×3 IMPLANT
GLOVE INDICATOR 8.0 STRL GRN (GLOVE) ×3 IMPLANT
GOWN STRL REUS W/ TWL LRG LVL3 (GOWN DISPOSABLE) ×2 IMPLANT
GOWN STRL REUS W/TWL LRG LVL3 (GOWN DISPOSABLE) ×4
IV LACTATED RINGER IRRG 3000ML (IV SOLUTION) ×8
IV LR IRRIG 3000ML ARTHROMATIC (IV SOLUTION) ×4 IMPLANT
KIT RM TURNOVER STRD PROC AR (KITS) ×3 IMPLANT
MANIFOLD NEPTUNE II (INSTRUMENTS) ×3 IMPLANT
PACK ARTHROSCOPY KNEE (MISCELLANEOUS) ×3 IMPLANT
PAD CAST CTTN 4X4 STRL (SOFTGOODS) ×1 IMPLANT
PADDING CAST COTTON 4X4 STRL (SOFTGOODS) ×2
SET TUBE SUCT SHAVER OUTFL 24K (TUBING) ×3 IMPLANT
SET TUBE TIP INTRA-ARTICULAR (MISCELLANEOUS) ×3 IMPLANT
STOCKINETTE BIAS CUT 6 980064 (GAUZE/BANDAGES/DRESSINGS) ×3 IMPLANT
SUT ETHILON 3-0 FS-10 30 BLK (SUTURE) ×3
SUTURE EHLN 3-0 FS-10 30 BLK (SUTURE) ×1 IMPLANT
TUBING ARTHRO INFLOW-ONLY STRL (TUBING) ×3 IMPLANT
WAND HAND CNTRL MULTIVAC 50 (MISCELLANEOUS) ×3 IMPLANT
WRAP KNEE W/COLD PACKS 25.5X14 (SOFTGOODS) ×3 IMPLANT

## 2016-06-27 NOTE — Op Note (Signed)
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   ASSISTANT: none  ANESTHESIA: general  ESTIMATED BLOOD LOSS: Minimal  FLUIDS REPLACED: 700 mL of crystalloid  TOURNIQUET TIME: Not used   DRAINS: none  IMPLANTS UTILIZED: None  INDICATIONS FOR SURGERY: Katrina MachoMarite Koors is a 71 y.o. year old female who has been seen for complaints of left knee pain. MRI demonstrated findings consistent with meniscal pathology. After discussion of the risks and benefits of surgical intervention, the patient expressed understanding of the risks benefits and agree with plans for left knee arthroscopy.   PROCEDURE IN DETAIL: The patient was brought into the operating room and, after adequate general anesthesia was achieved, a tourniquet was applied to the left thigh and the leg was placed in the leg holder. All bony prominences were well padded. The patient's left knee was cleaned and prepped with alcohol and Duraprep and draped in the usual sterile fashion. A "timeout" was performed as per usual protocol. The anticipated portal sites were injected with 0.25% Marcaine with epinephrine. An anterolateral incision was made and a cannula was inserted. A small effusion was evacuated and the knee was distended with fluid using the pump. The scope was advanced down the medial gutter into the medial compartment. Under visualization with the scope, an anteromedial portal was created and a hooked probe was inserted. The medial meniscus was visualized and probed. There was a complex degenerative tear of the posterior  horn of the medial meniscus. The tear was debrided using meniscal punches and a 4.5 mm incisor shaver. Final contouring was performed using a 50 wand. The articular cartilage was visualized. There was a relatively extensive area of grade 4 chondromalacia involving the medial tibial plateau. A smaller area of grade 3 and grade 4 chondromalacia to the medial femoral condyle was also noted. These areas were debrided and contoured using the wand.  The scope was then advanced into the intercondylar notch. The anterior cruciate ligament was visualized and probed and felt to be intact. The scope was removed from the lateral portal and reinserted via the anteromedial portal to better visualize the lateral compartment. The lateral meniscus was visualized and probed. The lateral meniscus was intact without tear or gross instability. The articular cartilage of the lateral compartment was visualized and noted to be in good condition. Finally, the scope was advanced so as to visualize the patellofemoral articulation. Good patellar tracking was appreciated. The articular surface was in good condition.  The knee was irrigated with copius amounts of fluid and suctioned dry. The anterolateral portal was re-approximated with #3-0 nylon. A combination of 0.25% Marcaine with epinephrine and 4 mg of Morphine were injected via the scope. The scope was removed and the anteromedial portal was re-approximated with #3-0 nylon. A sterile dressing was applied followed by application of an ice wrap.  The patient tolerated the procedure well and was transported to the PACU in stable condition.  James P. Angie FavaHooten, Jr., M.D.

## 2016-06-27 NOTE — Anesthesia Preprocedure Evaluation (Signed)
Anesthesia Evaluation  Patient identified by MRN, date of birth, ID band Patient awake    Reviewed: Allergy & Precautions, NPO status , Patient's Chart, lab work & pertinent test results  History of Anesthesia Complications Negative for: history of anesthetic complications  Airway Mallampati: II  TM Distance: >3 FB Neck ROM: Full    Dental no notable dental hx.    Pulmonary neg sleep apnea, neg COPD, former smoker,    breath sounds clear to auscultation- rhonchi (-) wheezing      Cardiovascular Exercise Tolerance: Good hypertension, Pt. on medications (-) CAD and (-) Past MI  Rhythm:Regular Rate:Normal - Systolic murmurs and - Diastolic murmurs    Neuro/Psych PSYCHIATRIC DISORDERS Depression negative neurological ROS     GI/Hepatic negative GI ROS, Neg liver ROS,   Endo/Other  negative endocrine ROSneg diabetes  Renal/GU Renal disease: hx of nephrolithiasis.     Musculoskeletal  (+) Arthritis ,   Abdominal (+) - obese,   Peds  Hematology negative hematology ROS (+)   Anesthesia Other Findings Past Medical History: No date: Arthritis No date: Cystocele No date: Depression No date: History of kidney stones No date: Hx of mitral valve insufficiency No date: Hypertension No date: Insomnia No date: Menopausal and perimenopausal disorder No date: Rectocele No date: Vaginal enterocele No date: Vaginal Pap smear, abnormal   Reproductive/Obstetrics                             Anesthesia Physical Anesthesia Plan  ASA: II  Anesthesia Plan: General   Post-op Pain Management:    Induction: Intravenous  Airway Management Planned: LMA  Additional Equipment:   Intra-op Plan:   Post-operative Plan:   Informed Consent: I have reviewed the patients History and Physical, chart, labs and discussed the procedure including the risks, benefits and alternatives for the proposed anesthesia  with the patient or authorized representative who has indicated his/her understanding and acceptance.   Dental advisory given  Plan Discussed with: CRNA and Anesthesiologist  Anesthesia Plan Comments:         Anesthesia Quick Evaluation

## 2016-06-27 NOTE — Anesthesia Post-op Follow-up Note (Cosign Needed)
Anesthesia QCDR form completed.        

## 2016-06-27 NOTE — Anesthesia Procedure Notes (Signed)
Procedure Name: LMA Insertion Date/Time: 06/27/2016 6:28 PM Performed by: Waldo LaineJUSTIS, Vidyuth Belsito Pre-anesthesia Checklist: Patient identified, Emergency Drugs available, Suction available, Patient being monitored and Timeout performed Patient Re-evaluated:Patient Re-evaluated prior to inductionOxygen Delivery Method: Circle system utilized Preoxygenation: Pre-oxygenation with 100% oxygen Intubation Type: IV induction Ventilation: Mask ventilation without difficulty LMA: LMA inserted LMA Size: 3.5 Placement Confirmation: positive ETCO2

## 2016-06-27 NOTE — Anesthesia Postprocedure Evaluation (Signed)
Anesthesia Post Note  Patient: [name]  Procedure(s) Performed: Procedure(s) (LRB): ARTHROSCOPY LEFT  KNEE, PARTIAL MEDIAL MENISECTOMY, MEDIAL CHONDROPLASTY (Left)  Patient location during evaluation: PACU Anesthesia Type: General Level of consciousness: awake and alert and oriented Pain management: pain level controlled Vital Signs Assessment: post-procedure vital signs reviewed and stable Respiratory status: spontaneous breathing, nonlabored ventilation and respiratory function stable Cardiovascular status: blood pressure returned to baseline and stable Postop Assessment: no signs of nausea or vomiting Anesthetic complications: no     Last Vitals:  Vitals:   06/27/16 2105 06/27/16 2115  BP:  124/66  Pulse: 70 65  Resp: (!) 21   Temp: 36.2 C     Last Pain:  Vitals:   06/27/16 1607  TempSrc: Temporal  PainSc: 1                  Dyllan Hughett

## 2016-06-27 NOTE — Discharge Instructions (Signed)
AMBULATORY SURGERY  °DISCHARGE INSTRUCTIONS ° ° °1) The drugs that you were given will stay in your system until tomorrow so for the next 24 hours you should not: ° °A) Drive an automobile °B) Make any legal decisions °C) Drink any alcoholic beverage ° ° °2) You may resume regular meals tomorrow.  Today it is better to start with liquids and gradually work up to solid foods. ° °You may eat anything you prefer, but it is better to start with liquids, then soup and crackers, and gradually work up to solid foods. ° ° °3) Please notify your doctor immediately if you have any unusual bleeding, trouble breathing, redness and pain at the surgery site, drainage, fever, or pain not relieved by medication. ° ° ° °4) Additional Instructions: ° ° ° ° ° ° ° °Please contact your physician with any problems or Same Day Surgery at [phone], Monday through Friday 6 am to 4 pm, or [place] [facility] [place] [facility] number at [phone]. ° ° °Instructions after Knee Arthroscopy  ° ° James P. Hooten, Jr., M.D.    ° Dept. of Orthopaedics & Sports Medicine ° [facility] ° [address] ° [place], [place]  [address] ° ° Phone: [phone]   Fax: [phone] ° ° °DIET: °• Drink plenty of non-alcoholic fluids & begin a light diet. °• Resume your normal diet the day after surgery. ° °ACTIVITY:  °• You may use crutches or a walker with weight-bearing as tolerated, unless instructed otherwise. °• You may wean yourself off of the walker or crutches as tolerated.  °• Begin doing gentle exercises. Exercising will reduce the pain and swelling, increase motion, and prevent muscle weakness.   °• Avoid strenuous activities or athletics for a minimum of 4-6 weeks after arthroscopic surgery. °• Do not drive or operate any equipment until instructed. ° °WOUND CARE:  °• Place one to two pillows under the knee the first day or two when sitting or lying.  °• Continue to use the ice packs periodically to reduce pain and swelling. °• The small  incisions in your knee are closed with nylon stitches. The stitches will be removed in the office. °• The bulky dressing may be removed on the second day after surgery. DO NOT TOUCH THE STITCHES. Put a Band-Aid over each stitch. Do NOT use any ointments or creams on the incisions.  °• You may bathe or shower after the stitches are removed at the first office visit following surgery. ° °MEDICATIONS: °• You may resume your regular medications. °• Please take the pain medication as prescribed. °• Do not take pain medication on an empty stomach. °• Do not drive or drink alcoholic beverages when taking pain medications. ° °CALL THE OFFICE FOR: °• Temperature above 101 degrees °• Excessive bleeding or drainage on the dressing. °• Excessive swelling, coldness, or paleness of the toes. °• Persistent nausea and vomiting. ° °FOLLOW-UP:  °• You should have an appointment to return to the office in 7-10 days after surgery.  °  °

## 2016-06-27 NOTE — H&P (Signed)
The patient has been re-examined, and the chart reviewed, and there have been no interval changes to the documented history and physical.    The risks, benefits, and alternatives have been discussed at length. The patient expressed understanding of the risks benefits and agreed with plans for surgical intervention.  Canaan Holzer P. Cristobal Advani, Jr. M.D.    

## 2016-06-27 NOTE — Transfer of Care (Signed)
Immediate Anesthesia Transfer of Care Note  Patient: Katrina Brown  Procedure(s) Performed: Procedure(s): ARTHROSCOPY LEFT  KNEE, PARTIAL MEDIAL MENISECTOMY, MEDIAL CHONDROPLASTY (Left)  Patient Location: PACU  Anesthesia Type:General  Level of Consciousness: sedated and patient cooperative  Airway & Oxygen Therapy: Patient Spontanous Breathing and Patient connected to face mask oxygen  Post-op Assessment: Report given to RN and Post -op Vital signs reviewed and stable  Post vital signs: Reviewed and stable  Last Vitals:  Vitals:   06/27/16 1607  BP: (!) 147/57  Pulse: 75  Resp: 16  Temp: 36.9 C    Last Pain:  Vitals:   06/27/16 1607  TempSrc: Temporal  PainSc: 1       Patients Stated Pain Goal: 1 (06/27/16 1607)  Complications: No apparent anesthesia complications

## 2016-06-28 ENCOUNTER — Encounter: Payer: Self-pay | Admitting: Orthopedic Surgery

## 2017-01-24 ENCOUNTER — Ambulatory Visit (INDEPENDENT_AMBULATORY_CARE_PROVIDER_SITE_OTHER): Payer: Medicare Other | Admitting: Obstetrics and Gynecology

## 2017-01-24 ENCOUNTER — Encounter: Payer: Self-pay | Admitting: Obstetrics and Gynecology

## 2017-01-24 VITALS — BP 152/70 | HR 72 | Ht 62.0 in | Wt 129.2 lb

## 2017-01-24 DIAGNOSIS — Z780 Asymptomatic menopausal state: Secondary | ICD-10-CM

## 2017-01-24 DIAGNOSIS — N8111 Cystocele, midline: Secondary | ICD-10-CM

## 2017-01-24 NOTE — Progress Notes (Signed)
Chief complaint: 1. Status post anterior/posterior colporrhaphy with enterocele ligation 2. History of pelvic organ prolapse previously treated with pessary  71 year old female status post anterior/posterior colporrhaphy with enterocele ligation 2 years ago presents for interval evaluation. Bowel and bladder function are normal. She denies any vaginal discharge, pelvic pressure, or vaginal bleeding.  Past Medical History:  Diagnosis Date  . Arthritis   . Cystocele   . Depression   . History of kidney stones   . Hx of mitral valve insufficiency   . Hypertension   . Insomnia   . Menopausal and perimenopausal disorder   . Rectocele   . Vaginal enterocele   . Vaginal Pap smear, abnormal    Past Surgical History:  Procedure Laterality Date  . ABDOMINAL HYSTERECTOMY  1976   [facility]  . ANTERIOR AND POSTERIOR VAGINAL REPAIR  05/12/2014  . APPENDECTOMY    . ENTEROCELE REPAIR  05/2014  . KNEE ARTHROSCOPY Left 06/27/2016   Procedure: ARTHROSCOPY LEFT  KNEE, PARTIAL MEDIAL MENISECTOMY, MEDIAL CHONDROPLASTY;  Surgeon: Donato Heinz, MD;  Location: [facility];  Service: Orthopedics;  Laterality: Left;  . KNEE SURGERY Right 2010    OBJECTIVE: BP (!) 152/70   Pulse 72   Ht  (1.575 m)   Wt 129 lb 3.2 oz (58.6 kg)   BMI 23.63 kg/m  Pleasant female in no acute distress. Alert and oriented. Abdomen: Soft, nontender Pelvic exam: External genitalia-normal BUS-normal Vagina-good estrogen effect; first to second degree cystocele; there is good support at the urethrovesical junction; no rectocele; excellent support at the vaginal apex Bimanual-no palpable masses or tenderness Rectovaginal-normal external exam  ASSESSMENT: 1. First to second-degree cystocele, asymptomatic 2. Status post anterior/posterior colporrhaphy with enterocele ligation 2 years ago for pelvic organ prolapse 3. Vaginal atrophy, resolved with ERT  PLAN: 1. Continue with Estrace 1 mg daily 2. Limit heavy lifting as  much as possible to avoid recurrent prolapse 3. Return in 1 year for annual exam  A total of 15 minutes were spent face-to-face with the patient during this encounter and over half of that time dealt with counseling and coordination of care.  Herold Harms, MD  Note: This dictation was prepared with Dragon dictation along with smaller phrase technology. Any transcriptional errors that result from this process are unintentional.

## 2017-01-24 NOTE — Patient Instructions (Signed)
1. Continue Estrace 1 mg daily 2. Limit heavy lifting as much as possible to avoid worsening of pelvic organ prolapse 3. Return in 1 year for follow-up

## 2017-02-14 ENCOUNTER — Other Ambulatory Visit: Payer: Self-pay | Admitting: Internal Medicine

## 2017-02-14 DIAGNOSIS — Z1231 Encounter for screening mammogram for malignant neoplasm of breast: Secondary | ICD-10-CM

## 2017-03-02 ENCOUNTER — Ambulatory Visit
Admission: RE | Admit: 2017-03-02 | Discharge: 2017-03-02 | Disposition: A | Discharge status: Home / Self Care | Payer: Medicare Other | Source: Ambulatory Visit | Attending: Internal Medicine | Admitting: Internal Medicine

## 2017-03-02 ENCOUNTER — Other Ambulatory Visit: Payer: Self-pay | Admitting: Internal Medicine

## 2017-03-02 DIAGNOSIS — Z1231 Encounter for screening mammogram for malignant neoplasm of breast: Secondary | ICD-10-CM | POA: Insufficient documentation

## 2017-07-12 ENCOUNTER — Ambulatory Visit (INDEPENDENT_AMBULATORY_CARE_PROVIDER_SITE_OTHER): Payer: Medicare Other | Admitting: Obstetrics and Gynecology

## 2017-07-12 ENCOUNTER — Encounter: Payer: Self-pay | Admitting: Obstetrics and Gynecology

## 2017-07-12 VITALS — BP 149/76 | HR 69 | Ht 62.0 in | Wt 130.8 lb

## 2017-07-12 DIAGNOSIS — N8111 Cystocele, midline: Secondary | ICD-10-CM

## 2017-07-12 NOTE — Progress Notes (Signed)
Chief complaint: 1.  Cystocele, symptomatic  Patient is status post posterior colporrhaphy with enterocele ligation on 05/15/2014 for management of symptomatic large enterocele and large rectocele.  She has done well since the surgery until recently when she noted increased pelvic pressure.  She feels that it is more difficult to void completely; urine stream is slower and occasionally she has to push the bladder back up in order to completely evacuate urine from the bladder.  If she is not leaking with coughing sneezing laughing or lifting.  She does not have urge symptoms. She is taking Estrace 1 mg daily for ERT and  maintenance of the vaginal mucosa.  Past medical history, past surgical history, problem list, medications, and allergies are reviewed  OBJECTIVE: BP (!) 149/76   Pulse 69   Ht 5\' 2"  (1.575 m)   Wt 130 lb 12.8 oz (59.3 kg)   BMI 23.92 kg/m  Pleasant well-appearing female in no acute distress.  Alert and oriented. Abdomen: Soft, nontender without organomegaly Pelvic exam: External genitalia-normal BUS-normal Vagina-good estrogen effect; first to second-degree cystocele with Valsalva; there is good support at the urethrovesical junction; there is no rectocele; there is excellent support of the vaginal apex. Bimanual-no palpable masses or tenderness Rectovaginal-normal external exam  PROCEDURE: Pessary fitting  70 mm incontinence dish with notch-not optimally tolerated  #4  Ring with diaphragm pessary-successful shift.  ASSESSMENT: 1.  Symptomatic first to second-degree cystocele 2.  Status post posterior colporrhaphy with enterocele ligation for large rectocele and large enterocele 3 years ago  PLAN: 1.  Continue with Estrace 1 mg daily 2.  #4 ring with diaphragm pessary ordered 3.  Return in 1 week for insertion after pessary arrives  A total of 15 minutes were spent face-to-face with the patient during this encounter and over half of that time dealt with counseling  and coordination of care.  Herold HarmsMartin A Eulice Rutledge, MD  Note: This dictation was prepared with Dragon dictation along with smaller phrase technology. Any transcriptional errors that result from this process are unintentional.

## 2017-07-12 NOTE — Patient Instructions (Addendum)
1.  Ring with diaphragm support pessary, #4 is fitted today 2.  Return in 1 week when pessary arrives for insertion

## 2017-07-17 ENCOUNTER — Telehealth: Payer: Self-pay | Admitting: Obstetrics and Gynecology

## 2017-07-17 NOTE — Telephone Encounter (Signed)
lmtrc

## 2017-07-17 NOTE — Telephone Encounter (Signed)
The patient called and stated that she needs to speak with Darol Destinerystal Miller in regards to some questions she has. Please advise.

## 2017-07-18 NOTE — Telephone Encounter (Signed)
Pt would prefer to have surgery instead of the pessary. Advised will send message to mad.

## 2017-07-19 NOTE — Telephone Encounter (Signed)
Pt aware per mad she needs an appt to discuss surgery- appt made for 07/26/2017 at 9:15.

## 2017-07-26 ENCOUNTER — Encounter: Payer: Self-pay | Admitting: Obstetrics and Gynecology

## 2017-07-26 ENCOUNTER — Ambulatory Visit (INDEPENDENT_AMBULATORY_CARE_PROVIDER_SITE_OTHER): Payer: Medicare Other | Admitting: Obstetrics and Gynecology

## 2017-07-26 VITALS — BP 156/80 | HR 75 | Ht 62.0 in | Wt 127.8 lb

## 2017-07-26 DIAGNOSIS — R32 Unspecified urinary incontinence: Secondary | ICD-10-CM | POA: Diagnosis not present

## 2017-07-26 DIAGNOSIS — N8111 Cystocele, midline: Secondary | ICD-10-CM | POA: Diagnosis not present

## 2017-07-26 NOTE — Progress Notes (Signed)
Chief complaint: 1.  Cystocele, symptomatic  The patient presents today for management conference regarding symptomatic cystocele.  Patient is status post posterior colporrhaphy with enterocele ligation on 05/15/2014 for management of a large enterocele and large rectocele.  She has done well since surgery until recently when she noted increased pelvic pressure and intermittent loss of urine not always associated with stress.  She states that her urine stream is a little slower that she occasionally has to push the bladder back up in order to completely evacuate her bladder.  She is not experiencing any urge symptoms.  At last visit on 07/12/2017 she was fitted for a pessary; however, she does not wish to pursue nonsurgical management of this condition.  She would like to proceed with definitive surgery.  Patient is currently taking Estrace 1 mg tablets daily for her ERT.  She is not currently using any vaginal estrogen.  OBJECTIVE: BP (!) 156/80   Pulse 75   Ht 5\' 2"  (1.575 m)   Wt 127 lb 12.8 oz (58 kg)   BMI 23.37 kg/m  Pleasant female in no acute distress. Physical exam-deferred  ASSESSMENT: 1.  Symptomatic first to second-degree cystocele with intermittent urine leakage and chronic pelvic pressure 2.  Status post posterior colporrhaphy with enterocele ligation for large rectocele and large enterocele 3 years ago  PLAN: 1.  Continue with Estrace 1 mg daily 2.  Begin using Premarin cream 1/2 g intravaginal nightly until surgery 3.  Return for preop appointment prior to scheduled anterior colporrhaphy 4.  Patient was counseled regarding the benefits and risks of such surgery.  She does understand that a pubovaginal sling may be necessary if she has persistent incontinence postoperatively.  She also understands that she may have some transient urinary retention during the repair.  A total of 15 minutes were spent face-to-face with the patient during this encounter and over half of that  time dealt with counseling and coordination of care.  Herold HarmsMartin A Keyshla Tunison, MD  Note: This dictation was prepared with Dragon dictation along with smaller phrase technology. Any transcriptional errors that result from this process are unintentional.

## 2017-07-26 NOTE — Patient Instructions (Signed)
1.  Anterior colporrhaphy will be scheduled within 3-4 weeks 2.  Return the week before surgery for preop appointment 3.  Continue using Premarin cream intravaginal 1/2 g nightly until preop

## 2017-07-28 ENCOUNTER — Telehealth: Payer: Self-pay | Admitting: Obstetrics and Gynecology

## 2017-07-31 NOTE — Telephone Encounter (Signed)
error 

## 2017-08-02 ENCOUNTER — Encounter: Payer: Self-pay | Admitting: Obstetrics and Gynecology

## 2017-08-02 ENCOUNTER — Ambulatory Visit (INDEPENDENT_AMBULATORY_CARE_PROVIDER_SITE_OTHER): Payer: Medicare Other | Admitting: Obstetrics and Gynecology

## 2017-08-02 VITALS — BP 144/74 | HR 80 | Ht 62.0 in | Wt 128.3 lb

## 2017-08-02 DIAGNOSIS — N8111 Cystocele, midline: Secondary | ICD-10-CM

## 2017-08-02 DIAGNOSIS — Z01818 Encounter for other preprocedural examination: Secondary | ICD-10-CM

## 2017-08-02 NOTE — Progress Notes (Signed)
PREOP HISTORY AND PHYSICAL  Date of surgery: 08/07/2017 Diagnosis: Symptomatic cystocele Procedure: Anterior colporrhaphy with Tresa Endo plication   Patient is a 72 y.o. No obstetric history on file.female scheduled for surgery on 08/07/2017. She is status post posterior colporrhaphy with enterocele ligation on 05/15/2014 for management of a large enterocele and large rectocele. She has done well since surgery until recently when she noted increased pelvic pressure and intermittent loss of urine, not always associated with stress.  Urine stream is also noted to be a little bit slower and she occasionally has to push the bladder up in order to complete voiding.  She is not experiencing any significant urge symptoms. She is not interested in pessary use. Currently the patient is taking Estrace 1 mg tablets daily for ERT.  She has recently begun using some estrogen cream intravaginal  OB History   None    No LMP recorded. Patient has had a hysterectomy.    Past Medical History:  Diagnosis Date  . Arthritis   . Cystocele   . Depression   . History of kidney stones   . Hx of mitral valve insufficiency   . Hypertension   . Insomnia   . Menopausal and perimenopausal disorder   . Rectocele   . Vaginal enterocele   . Vaginal Pap smear, abnormal     Past Surgical History:  Procedure Laterality Date  . ABDOMINAL HYSTERECTOMY  1976   [place]  . ANTERIOR AND POSTERIOR VAGINAL REPAIR  05/12/2014  . APPENDECTOMY    . ENTEROCELE REPAIR  05/2014  . KNEE ARTHROSCOPY Left 06/27/2016   Procedure: ARTHROSCOPY LEFT  KNEE, PARTIAL MEDIAL MENISECTOMY, MEDIAL CHONDROPLASTY;  Surgeon: Donato Heinz, MD;  Location: [facility];  Service: Orthopedics;  Laterality: Left;  . KNEE SURGERY Right 2010    OB History  No data available    Social History   Socioeconomic History  . Marital status: Married    Spouse name: Not on file  . Number of children: Not on file  . Years of education: Not on file  .  Highest education level: Not on file  Occupational History  . Not on file  Social Needs  . Financial resource strain: Not on file  . Food insecurity:    Worry: Not on file    Inability: Not on file  . Transportation needs:    Medical: Not on file    Non-medical: Not on file  Tobacco Use  . Smoking status: Former Smoker    Packs/day: 0.25    Years: 20.00    Pack years: 5.00    Types: Cigarettes    Last attempt to quit: 06/20/2008    Years since quitting: 9.1  . Smokeless tobacco: Never Used  Substance and Sexual Activity  . Alcohol use: Yes    Comment: RED WINE EVERY NIGHT  . Drug use: No  . Sexual activity: Yes    Birth control/protection: Surgical  Lifestyle  . Physical activity:    Days per week: Not on file    Minutes per session: Not on file  . Stress: Not on file  Relationships  . Social connections:    Talks on phone: Not on file    Gets together: Not on file    Attends religious service: Not on file    Active member of club or organization: Not on file    Attends meetings of clubs or organizations: Not on file    Relationship status: Not on file  Other Topics Concern  .  Not on file  Social History Narrative  . Not on file    Family History  Problem Relation Age of Onset  . Cancer Neg Hx      (Not in a hospital admission)  Allergies  Allergen Reactions  . Penicillins Other (See Comments)    Unsure of reaction.  Has patient had a PCN reaction causing immediate rash, facial/tongue/throat swelling, SOB or lightheadedness with hypotension:unsure Has patient had a PCN reaction causing severe rash involving mucus membranes or skin necrosis:unsure Has patient had a PCN reaction that required hospitalization:No Has patient had a PCN reaction occurring within the last 10 years:No If all of the above answers are "NO", then may proceed with Cephalosporin use. Childhood reaction.    Review of Systems Constitutional: No recent fever/chills/sweats Respiratory:  No recent cough/bronchitis Cardiovascular: No chest pain Gastrointestinal: No recent nausea/vomiting/diarrhea Genitourinary: No UTI symptoms Hematologic/lymphatic:No history of coagulopathy or recent blood thinner use    Objective:    BP (!) 144/74   Pulse 80   Ht 5\' 2"  (1.575 m)   Wt 128 lb 4.8 oz (58.2 kg)   BMI 23.47 kg/m   General:   Normal  Skin:   normal  HEENT:  Normal  Neck:  Supple without Adenopathy or Thyromegaly  Lungs:   Heart:              Breasts:   Abdomen:  Pelvis:  M/S   Extremeties:  Neuro:    clear to auscultation bilaterally   Normal without murmur   Not Examined   soft, non-tender; bowel sounds normal; no masses,  no organomegaly   Exam deferred to OR  No CVAT  Warm/Dry   Normal       07/12/2017 Pelvic exam: External genitalia-normal BUS-normal Vagina-good estrogen effect; first to second-degree cystocele with Valsalva; there is good support at the urethrovesical junction; there is no rectocele; there is excellent support of the vaginal apex. Bimanual-no palpable masses or tenderness Rectovaginal-normal external exam    Assessment:    Symptomatic cystocele Status post posterior colporrhaphy with enterocele ligation   Plan:  Anterior colporrhaphy with Kelly plication  Preop counseling: Patient has been counseled regarding the upcoming surgery.  She is understanding of the planned procedure and is aware of and is accepting of all surgical risks which include but are not limited to bleeding, infection, pelvic organ injury with need for repair, blood clot disorders, anesthesia risk, etc.  All questions have been answered.  Informed consent is given.  Patient is ready willing to proceed with surgery as scheduled.  Herold Harms, MD  Note: This dictation was prepared with Dragon dictation along with smaller phrase technology. Any transcriptional errors that result from this process are unintentional.

## 2017-08-02 NOTE — H&P (Signed)
PREOP HISTORY AND PHYSICAL  Date of surgery: 08/07/2017 Diagnosis: Symptomatic cystocele Procedure: Anterior colporrhaphy with Tresa Endo plication   Patient is a 72 y.o. No obstetric history on file.female scheduled for surgery on 08/07/2017. She is status post posterior colporrhaphy with enterocele ligation on 05/15/2014 for management of a large enterocele and large rectocele. She has done well since surgery until recently when she noted increased pelvic pressure and intermittent loss of urine, not always associated with stress.  Urine stream is also noted to be a little bit slower and she occasionally has to push the bladder up in order to complete voiding.  She is not experiencing any significant urge symptoms. She is not interested in pessary use. Currently the patient is taking Estrace 1 mg tablets daily for ERT.  She has recently begun using some estrogen cream intravaginal  OB History   None    No LMP recorded. Patient has had a hysterectomy.    Past Medical History:  Diagnosis Date  . Arthritis   . Cystocele   . Depression   . History of kidney stones   . Hx of mitral valve insufficiency   . Hypertension   . Insomnia   . Menopausal and perimenopausal disorder   . Rectocele   . Vaginal enterocele   . Vaginal Pap smear, abnormal     Past Surgical History:  Procedure Laterality Date  . ABDOMINAL HYSTERECTOMY  1976   [place]  . ANTERIOR AND POSTERIOR VAGINAL REPAIR  05/12/2014  . APPENDECTOMY    . ENTEROCELE REPAIR  05/2014  . KNEE ARTHROSCOPY Left 06/27/2016   Procedure: ARTHROSCOPY LEFT  KNEE, PARTIAL MEDIAL MENISECTOMY, MEDIAL CHONDROPLASTY;  Surgeon: Donato Heinz, MD;  Location: [facility];  Service: Orthopedics;  Laterality: Left;  . KNEE SURGERY Right 2010    OB History  No data available    Social History   Socioeconomic History  . Marital status: Married    Spouse name: Not on file  . Number of children: Not on file  . Years of education: Not on file  .  Highest education level: Not on file  Occupational History  . Not on file  Social Needs  . Financial resource strain: Not on file  . Food insecurity:    Worry: Not on file    Inability: Not on file  . Transportation needs:    Medical: Not on file    Non-medical: Not on file  Tobacco Use  . Smoking status: Former Smoker    Packs/day: 0.25    Years: 20.00    Pack years: 5.00    Types: Cigarettes    Last attempt to quit: 06/20/2008    Years since quitting: 9.1  . Smokeless tobacco: Never Used  Substance and Sexual Activity  . Alcohol use: Yes    Comment: RED WINE EVERY NIGHT  . Drug use: No  . Sexual activity: Yes    Birth control/protection: Surgical  Lifestyle  . Physical activity:    Days per week: Not on file    Minutes per session: Not on file  . Stress: Not on file  Relationships  . Social connections:    Talks on phone: Not on file    Gets together: Not on file    Attends religious service: Not on file    Active member of club or organization: Not on file    Attends meetings of clubs or organizations: Not on file    Relationship status: Not on file  Other Topics Concern  .  Not on file  Social History Narrative  . Not on file    Family History  Problem Relation Age of Onset  . Cancer Neg Hx      (Not in a hospital admission)  Allergies  Allergen Reactions  . Penicillins Other (See Comments)    Unsure of reaction.  Has patient had a PCN reaction causing immediate rash, facial/tongue/throat swelling, SOB or lightheadedness with hypotension:unsure Has patient had a PCN reaction causing severe rash involving mucus membranes or skin necrosis:unsure Has patient had a PCN reaction that required hospitalization:No Has patient had a PCN reaction occurring within the last 10 years:No If all of the above answers are "NO", then may proceed with Cephalosporin use. Childhood reaction.    Review of Systems Constitutional: No recent fever/chills/sweats Respiratory:  No recent cough/bronchitis Cardiovascular: No chest pain Gastrointestinal: No recent nausea/vomiting/diarrhea Genitourinary: No UTI symptoms Hematologic/lymphatic:No history of coagulopathy or recent blood thinner use    Objective:    BP (!) 144/74   Pulse 80   Ht 5\' 2"  (1.575 m)   Wt 128 lb 4.8 oz (58.2 kg)   BMI 23.47 kg/m   General:   Normal  Skin:   normal  HEENT:  Normal  Neck:  Supple without Adenopathy or Thyromegaly  Lungs:   Heart:              Breasts:   Abdomen:  Pelvis:  M/S   Extremeties:  Neuro:    clear to auscultation bilaterally   Normal without murmur   Not Examined   soft, non-tender; bowel sounds normal; no masses,  no organomegaly   Exam deferred to OR  No CVAT  Warm/Dry   Normal       07/12/2017 Pelvic exam: External genitalia-normal BUS-normal Vagina-good estrogen effect; first to second-degree cystocele with Valsalva; there is good support at the urethrovesical junction; there is no rectocele; there is excellent support of the vaginal apex. Bimanual-no palpable masses or tenderness Rectovaginal-normal external exam    Assessment:    Symptomatic cystocele Status post posterior colporrhaphy with enterocele ligation   Plan:  Anterior colporrhaphy with Kelly plication  Preop counseling: Patient has been counseled regarding the upcoming surgery.  She is understanding of the planned procedure and is aware of and is accepting of all surgical risks which include but are not limited to bleeding, infection, pelvic organ injury with need for repair, blood clot disorders, anesthesia risk, etc.  All questions have been answered.  Informed consent is given.  Patient is ready willing to proceed with surgery as scheduled.  Herold HarmsMartin A Milessa Hogan, MD  Note: This dictation was prepared with Dragon dictation along with smaller phrase technology. Any transcriptional errors that result from this process are unintentional.

## 2017-08-02 NOTE — H&P (View-Only) (Signed)
PREOP HISTORY AND PHYSICAL  Date of surgery: 08/07/2017 Diagnosis: Symptomatic cystocele Procedure: Anterior colporrhaphy with Tresa Endo plication   Patient is a 72 y.o. No obstetric history on file.female scheduled for surgery on 08/07/2017. She is status post posterior colporrhaphy with enterocele ligation on 05/15/2014 for management of a large enterocele and large rectocele. She has done well since surgery until recently when she noted increased pelvic pressure and intermittent loss of urine, not always associated with stress.  Urine stream is also noted to be a little bit slower and she occasionally has to push the bladder up in order to complete voiding.  She is not experiencing any significant urge symptoms. She is not interested in pessary use. Currently the patient is taking Estrace 1 mg tablets daily for ERT.  She has recently begun using some estrogen cream intravaginal  OB History   None    No LMP recorded. Patient has had a hysterectomy.    Past Medical History:  Diagnosis Date  . Arthritis   . Cystocele   . Depression   . History of kidney stones   . Hx of mitral valve insufficiency   . Hypertension   . Insomnia   . Menopausal and perimenopausal disorder   . Rectocele   . Vaginal enterocele   . Vaginal Pap smear, abnormal     Past Surgical History:  Procedure Laterality Date  . ABDOMINAL [place]ST[place]CT[place]Y  [place]  . ANTERIOR AND POSTERIOR VAGINAL REPAIR  05/12/2014  . APPENDECTOMY    . ENTEROCELE REPAIR  05/2014  . KNEE ARTHROSCOPY Left 06/27/2016   Procedure: ARTHROSCOPY LEFT  KNEE, PARTIAL MEDIAL MENISECTOMY, MEDIAL CHONDROPLASTY;  Surgeon: Donato Heinz, MD;  Location: [facility] ORS;  Service: Orthopedics;  Laterality: Left;  . KNEE SURGERY Right 2010    OB History  No data available    Social History   Socioeconomic History  . Marital status: Married    Spouse name: Not on file  . Number of children: Not on file  . Years of education: Not on file  .  Highest education level: Not on file  Occupational History  . Not on file  Social Needs  . Financial resource strain: Not on file  . Food insecurity:    Worry: Not on file    Inability: Not on file  . Transportation needs:    Medical: Not on file    Non-medical: Not on file  Tobacco Use  . Smoking status: Former Smoker    Packs/day: 0.25    Years: 20.00    Pack years: 5.00    Types: Cigarettes    Last attempt to quit: 06/20/2008    Years since quitting: 9.1  . Smokeless tobacco: Never Used  Substance and Sexual Activity  . Alcohol use: Yes    Comment: RED WINE EVERY NIGHT  . Drug use: No  . Sexual activity: Yes    Birth control/protection: Surgical  Lifestyle  . Physical activity:    Days per week: Not on file    Minutes per session: Not on file  . Stress: Not on file  Relationships  . Social connections:    Talks on phone: Not on file    Gets together: Not on file    Attends religious service: Not on file    Active member of club or organization: Not on file    Attends meetings of clubs or organizations: Not on file    Relationship status: Not on file  Other Topics Concern  .  Not on file  Social History Narrative  . Not on file    Family History  Problem Relation Age of Onset  . Cancer Neg Hx      (Not in a hospital admission)  Allergies  Allergen Reactions  . Penicillins Other (See Comments)    Unsure of reaction.  Has patient had a PCN reaction causing immediate rash, facial/tongue/throat swelling, SOB or lightheadedness with hypotension:unsure Has patient had a PCN reaction causing severe rash involving mucus membranes or skin necrosis:unsure Has patient had a PCN reaction that required hospitalization:No Has patient had a PCN reaction occurring within the last 10 years:No If all of the above answers are "NO", then may proceed with Cephalosporin use. Childhood reaction.    Review of Systems Constitutional: No recent fever/chills/sweats Respiratory:  No recent cough/bronchitis Cardiovascular: No chest pain Gastrointestinal: No recent nausea/vomiting/diarrhea Genitourinary: No UTI symptoms Hematologic/lymphatic:No history of coagulopathy or recent blood thinner use    Objective:    BP (!) 144/74   Pulse 80   Ht 5\' 2"  (1.575 m)   Wt 128 lb 4.8 oz (58.2 kg)   BMI 23.47 kg/m   General:   Normal  Skin:   normal  HEENT:  Normal  Neck:  Supple without Adenopathy or Thyromegaly  Lungs:   Heart:              Breasts:   Abdomen:  Pelvis:  M/S   Extremeties:  Neuro:    clear to auscultation bilaterally   Normal without murmur   Not Examined   soft, non-tender; bowel sounds normal; no masses,  no organomegaly   Exam deferred to OR  No CVAT  Warm/Dry   Normal       07/12/2017 Pelvic exam: External genitalia-normal BUS-normal Vagina-good estrogen effect; first to second-degree cystocele with Valsalva; there is good support at the urethrovesical junction; there is no rectocele; there is excellent support of the vaginal apex. Bimanual-no palpable masses or tenderness Rectovaginal-normal external exam    Assessment:    Symptomatic cystocele Status post posterior colporrhaphy with enterocele ligation   Plan:  Anterior colporrhaphy with Kelly plication  Preop counseling: Patient has been counseled regarding the upcoming surgery.  She is understanding of the planned procedure and is aware of and is accepting of all surgical risks which include but are not limited to bleeding, infection, pelvic organ injury with need for repair, blood clot disorders, anesthesia risk, etc.  All questions have been answered.  Informed consent is given.  Patient is ready willing to proceed with surgery as scheduled.  Herold HarmsMartin A Shameka Aggarwal, MD  Note: This dictation was prepared with Dragon dictation along with smaller phrase technology. Any transcriptional errors that result from this process are unintentional.

## 2017-08-02 NOTE — Patient Instructions (Signed)
1.  Return for postop check 1 week after surgery 

## 2017-08-04 ENCOUNTER — Other Ambulatory Visit: Payer: Self-pay

## 2017-08-04 ENCOUNTER — Encounter
Admission: RE | Admit: 2017-08-04 | Discharge: 2017-08-04 | Disposition: A | Discharge status: Home / Self Care | Payer: Medicare Other | Source: Ambulatory Visit | Attending: Obstetrics and Gynecology | Admitting: Obstetrics and Gynecology

## 2017-08-04 DIAGNOSIS — Z01812 Encounter for preprocedural laboratory examination: Secondary | ICD-10-CM | POA: Diagnosis present

## 2017-08-04 DIAGNOSIS — I10 Essential (primary) hypertension: Secondary | ICD-10-CM | POA: Insufficient documentation

## 2017-08-04 DIAGNOSIS — Z01810 Encounter for preprocedural cardiovascular examination: Secondary | ICD-10-CM | POA: Insufficient documentation

## 2017-08-04 LAB — CBC WITH DIFFERENTIAL/PLATELET
BASOS ABS: 0 10*3/uL (ref 0–0.1)
BASOS PCT: 1 %
EOS ABS: 0.1 10*3/uL (ref 0–0.7)
Eosinophils Relative: 2 %
HCT: 37.6 % (ref 35.0–47.0)
HEMOGLOBIN: 12.5 g/dL (ref 12.0–16.0)
Lymphocytes Relative: 24 %
Lymphs Abs: 1.8 10*3/uL (ref 1.0–3.6)
MCH: 31.9 pg (ref 26.0–34.0)
MCHC: 33.3 g/dL (ref 32.0–36.0)
MCV: 95.6 fL (ref 80.0–100.0)
Monocytes Absolute: 0.6 10*3/uL (ref 0.2–0.9)
Monocytes Relative: 8 %
NEUTROS PCT: 65 %
Neutro Abs: 4.9 10*3/uL (ref 1.4–6.5)
Platelets: 320 10*3/uL (ref 150–440)
RBC: 3.93 MIL/uL (ref 3.80–5.20)
RDW: 13.7 % (ref 11.5–14.5)
WBC: 7.4 10*3/uL (ref 3.6–11.0)

## 2017-08-04 LAB — RAPID HIV SCREEN (HIV 1/2 AB+AG)
HIV 1/2 ANTIBODIES: NONREACTIVE
HIV-1 P24 ANTIGEN - HIV24: NONREACTIVE

## 2017-08-04 LAB — BASIC METABOLIC PANEL
Anion gap: 10 (ref 5–15)
BUN: 22 mg/dL — ABNORMAL HIGH (ref 6–20)
CALCIUM: 8.8 mg/dL — AB (ref 8.9–10.3)
CHLORIDE: 102 mmol/L (ref 101–111)
CO2: 26 mmol/L (ref 22–32)
Creatinine, Ser: 0.52 mg/dL (ref 0.44–1.00)
Glucose, Bld: 83 mg/dL (ref 65–99)
Potassium: 3.6 mmol/L (ref 3.5–5.1)
SODIUM: 138 mmol/L (ref 135–145)

## 2017-08-04 NOTE — Patient Instructions (Signed)
Your procedure is scheduled on: Monday August 07, 2017 Report to Day Surgery [facility]nd [facility] To find out your arrival time, please call [phone] between 1PM - 3PM on: Friday August 04, 2017  REMEMBER: Instructions that are not followed completely may result in serious medical risk, up to and including death; or upon the discretion of your surgeon and anesthesiologist your surgery may need to be rescheduled.  Do not eat food after midnight the night before your procedure.  No gum chewing, lozengers or hard candies.  You may however, drink CLEAR liquids up to 2 hours before you are scheduled to arrive for your surgery. Do not drink anything within 2 hours of the start of your surgery.  Clear liquids include: - water  - apple juice without pulp - clear gatorade - black coffee or tea (Do NOT add anything to the coffee or tea) Do NOT drink anything that is not on this list.  Type 1 and Type 2 diabetics should only drink water.  No Alcohol for 24 hours before or after surgery.  No Smoking including e-cigarettes for 24 hours prior to surgery.  No chewable tobacco products for at least 6 hours prior to surgery.  No nicotine patches on the day of surgery.  On the morning of surgery brush your teeth with toothpaste and water, you may rinse your mouth with mouthwash if you wish. Do not swallow any toothpaste or mouthwash.  Notify your doctor if there is any change in your medical condition (cold, fever, infection).  Do not wear jewelry, make-up, hairpins, clips or nail polish.  Do not wear lotions, powders, or perfumes. You may wear deodorant.  Do not shave 48 hours prior to surgery. Men may shave face and neck.  Contacts and dentures may not be worn into surgery.  Do not bring valuables to the hospital, including drivers license, insurance or credit cards.  [facility] is not responsible for any belongings or valuables.   TAKE THESE MEDICATIONS THE MORNING OF  SURGERY: EFFEXOR ESTRADIOL  Follow recommendations from Cardiologist, Pulmonologist or PCP regarding stopping Aspirin, Coumadin, Plavix, Eliquis, Pradaxa, or Pletal.  Stop Anti-inflammatories (NSAIDS) such as Advil, Aleve, Ibuprofen, Motrin, Naproxen, Naprosyn and Aspirin based products such as Excedrin, Goodys Powder, BC Powder. (May take Tylenol or Acetaminophen if needed.)  Stop ANY OVER THE COUNTER OMEGA 3, BIOTIN, TURMERIC  supplements until after surgery. (May continue Vitamin D, Vitamin B, and multivitamin.)  Wear comfortable clothing (specific to your surgery type) to the hospital.  Plan for stool softeners for home use.  If you are being admitted to the hospital overnight, leave your suitcase in the car. After surgery it may be brought to your room.  If you are being discharged the day of surgery, you will not be allowed to drive home. You will need a responsible adult to drive you home and stay with you that night.   If you are taking public transportation, you will need to have a responsible adult with you. Please confirm with your physician that it is acceptable to use public transportation.   Please call [phone] if you have any questions about these instructions.

## 2017-08-05 LAB — TYPE AND SCREEN
ABO/RH(D): O POS
Antibody Screen: NEGATIVE

## 2017-08-05 LAB — RPR: RPR: NONREACTIVE

## 2017-08-07 ENCOUNTER — Other Ambulatory Visit: Payer: Self-pay

## 2017-08-07 ENCOUNTER — Ambulatory Visit: Payer: Medicare Other | Admitting: Certified Registered Nurse Anesthetist

## 2017-08-07 ENCOUNTER — Encounter: Payer: Self-pay | Admitting: *Deleted

## 2017-08-07 ENCOUNTER — Encounter
Admission: RE | Disposition: A | Discharge status: Home / Self Care | Payer: Self-pay | Source: Ambulatory Visit | Attending: Obstetrics and Gynecology

## 2017-08-07 ENCOUNTER — Observation Stay
Admission: RE | Admit: 2017-08-07 | Discharge: 2017-08-08 | Disposition: A | Discharge status: Home / Self Care | Payer: Medicare Other | Source: Ambulatory Visit | Attending: Obstetrics and Gynecology | Admitting: Obstetrics and Gynecology

## 2017-08-07 DIAGNOSIS — N8110 Cystocele, unspecified: Principal | ICD-10-CM | POA: Insufficient documentation

## 2017-08-07 DIAGNOSIS — N815 Vaginal enterocele: Secondary | ICD-10-CM | POA: Insufficient documentation

## 2017-08-07 DIAGNOSIS — I10 Essential (primary) hypertension: Secondary | ICD-10-CM | POA: Diagnosis not present

## 2017-08-07 DIAGNOSIS — Z87891 Personal history of nicotine dependence: Secondary | ICD-10-CM | POA: Insufficient documentation

## 2017-08-07 DIAGNOSIS — N8111 Cystocele, midline: Secondary | ICD-10-CM | POA: Diagnosis present

## 2017-08-07 HISTORY — PX: CYSTOCELE REPAIR: SHX163

## 2017-08-07 LAB — ABO/RH: ABO/RH(D): O POS

## 2017-08-07 SURGERY — COLPORRHAPHY, ANTERIOR, FOR CYSTOCELE REPAIR
Anesthesia: General | Wound class: Clean Contaminated

## 2017-08-07 MED ORDER — MORPHINE SULFATE (PF) 2 MG/ML IV SOLN
1.0000 mg | INTRAVENOUS | Status: DC | PRN
  Administered 2017-08-08 (×2): 2 mg via INTRAVENOUS
  Filled 2017-08-07 (×2): qty 1

## 2017-08-07 MED ORDER — FENTANYL CITRATE (PF) 100 MCG/2ML IJ SOLN
INTRAMUSCULAR | Status: DC | PRN
  Administered 2017-08-07 (×4): 25 ug via INTRAVENOUS

## 2017-08-07 MED ORDER — DEXAMETHASONE SODIUM PHOSPHATE 10 MG/ML IJ SOLN
INTRAMUSCULAR | Status: DC | PRN
  Administered 2017-08-07: 10 mg via INTRAVENOUS

## 2017-08-07 MED ORDER — SIMETHICONE 80 MG PO CHEW: 80.0000 mg | CHEWABLE_TABLET | Freq: Four times a day (QID) | ORAL | Status: DC | PRN

## 2017-08-07 MED ORDER — ONDANSETRON HCL 4 MG/2ML IJ SOLN
INTRAMUSCULAR | Status: DC | PRN
  Administered 2017-08-07: 4 mg via INTRAVENOUS

## 2017-08-07 MED ORDER — KETOROLAC TROMETHAMINE 30 MG/ML IJ SOLN: 30.0000 mg | Freq: Four times a day (QID) | INTRAMUSCULAR | Status: DC

## 2017-08-07 MED ORDER — MEPERIDINE HCL 50 MG/ML IJ SOLN: 6.2500 mg | INTRAMUSCULAR | Status: DC | PRN

## 2017-08-07 MED ORDER — PROPOFOL 10 MG/ML IV BOLUS
INTRAVENOUS | Status: AC
  Filled 2017-08-07: qty 20

## 2017-08-07 MED ORDER — KETOROLAC TROMETHAMINE 30 MG/ML IJ SOLN
INTRAMUSCULAR | Status: AC
  Filled 2017-08-07: qty 1

## 2017-08-07 MED ORDER — EPHEDRINE SULFATE 50 MG/ML IJ SOLN
INTRAMUSCULAR | Status: DC | PRN
  Administered 2017-08-07: 5 mg via INTRAVENOUS

## 2017-08-07 MED ORDER — LACTATED RINGERS IV SOLN
INTRAVENOUS | Status: DC
  Administered 2017-08-08 (×2): via INTRAVENOUS

## 2017-08-07 MED ORDER — HYDROMORPHONE HCL 1 MG/ML IJ SOLN: 0.2500 mg | INTRAMUSCULAR | Status: DC | PRN

## 2017-08-07 MED ORDER — LACTATED RINGERS IV SOLN
INTRAVENOUS | Status: DC
  Administered 2017-08-07: 11:00:00 via INTRAVENOUS

## 2017-08-07 MED ORDER — FAMOTIDINE 20 MG PO TABS
20.0000 mg | ORAL_TABLET | Freq: Once | ORAL | Status: AC
  Administered 2017-08-07: 20 mg via ORAL

## 2017-08-07 MED ORDER — HYDROCODONE-ACETAMINOPHEN 7.5-325 MG PO TABS
1.0000 | ORAL_TABLET | Freq: Once | ORAL | Status: DC | PRN
  Filled 2017-08-07: qty 1

## 2017-08-07 MED ORDER — BISOPROLOL FUMARATE 5 MG PO TABS
5.0000 mg | ORAL_TABLET | Freq: Once | ORAL | Status: AC
  Administered 2017-08-07: 5 mg via ORAL
  Filled 2017-08-07: qty 1

## 2017-08-07 MED ORDER — ACETAMINOPHEN 10 MG/ML IV SOLN
INTRAVENOUS | Status: DC | PRN
  Administered 2017-08-07: 1000 mg via INTRAVENOUS

## 2017-08-07 MED ORDER — ESTROGENS, CONJUGATED 0.625 MG/GM VA CREA
TOPICAL_CREAM | VAGINAL | Status: DC | PRN
  Administered 2017-08-07: 1 via VAGINAL

## 2017-08-07 MED ORDER — MIDAZOLAM HCL 2 MG/2ML IJ SOLN
INTRAMUSCULAR | Status: AC
  Filled 2017-08-07: qty 2

## 2017-08-07 MED ORDER — LIDOCAINE HCL (CARDIAC) 20 MG/ML IV SOLN
INTRAVENOUS | Status: DC | PRN
  Administered 2017-08-07: 100 mg via INTRAVENOUS

## 2017-08-07 MED ORDER — BISACODYL 10 MG RE SUPP: 10.0000 mg | Freq: Every day | RECTAL | Status: DC | PRN

## 2017-08-07 MED ORDER — FENTANYL CITRATE (PF) 100 MCG/2ML IJ SOLN
INTRAMUSCULAR | Status: AC
  Filled 2017-08-07: qty 2

## 2017-08-07 MED ORDER — ESTROGENS, CONJUGATED 0.625 MG/GM VA CREA
TOPICAL_CREAM | VAGINAL | Status: AC
  Filled 2017-08-07: qty 30

## 2017-08-07 MED ORDER — PROPOFOL 10 MG/ML IV BOLUS
INTRAVENOUS | Status: DC | PRN
  Administered 2017-08-07: 180 mg via INTRAVENOUS
  Administered 2017-08-07: 20 mg via INTRAVENOUS

## 2017-08-07 MED ORDER — KETOROLAC TROMETHAMINE 30 MG/ML IJ SOLN
INTRAMUSCULAR | Status: DC | PRN
  Administered 2017-08-07: 30 mg via INTRAVENOUS

## 2017-08-07 MED ORDER — ACETAMINOPHEN NICU IV SYRINGE 10 MG/ML
INTRAVENOUS | Status: AC
  Filled 2017-08-07: qty 1

## 2017-08-07 MED ORDER — DOCUSATE SODIUM 100 MG PO CAPS
100.0000 mg | ORAL_CAPSULE | Freq: Two times a day (BID) | ORAL | Status: DC
  Administered 2017-08-07 – 2017-08-08 (×2): 100 mg via ORAL
  Filled 2017-08-07 (×2): qty 1

## 2017-08-07 MED ORDER — PROMETHAZINE HCL 25 MG/ML IJ SOLN: 6.2500 mg | INTRAMUSCULAR | Status: DC | PRN

## 2017-08-07 MED ORDER — FAMOTIDINE 20 MG PO TABS
ORAL_TABLET | ORAL | Status: AC
  Filled 2017-08-07: qty 1

## 2017-08-07 MED ORDER — MIDAZOLAM HCL 2 MG/2ML IJ SOLN
INTRAMUSCULAR | Status: DC | PRN
  Administered 2017-08-07: 2 mg via INTRAVENOUS

## 2017-08-07 MED ORDER — LIDOCAINE HCL (PF) 2 % IJ SOLN
INTRAMUSCULAR | Status: AC
  Filled 2017-08-07: qty 10

## 2017-08-07 MED ORDER — KETOROLAC TROMETHAMINE 30 MG/ML IJ SOLN
30.0000 mg | Freq: Four times a day (QID) | INTRAMUSCULAR | Status: DC
  Administered 2017-08-07 – 2017-08-08 (×3): 30 mg via INTRAVENOUS
  Filled 2017-08-07 (×3): qty 1

## 2017-08-07 SURGICAL SUPPLY — 26 items
BAG URINE DRAINAGE (UROLOGICAL SUPPLIES) ×3 IMPLANT
CANISTER SUCT 1200ML W/VALVE (MISCELLANEOUS) ×3 IMPLANT
CATH FOLEY 2WAY  5CC 16FR (CATHETERS) ×2
CATH URTH 16FR FL 2W BLN LF (CATHETERS) ×1 IMPLANT
DRAPE PERI LITHO V/GYN (MISCELLANEOUS) ×3 IMPLANT
DRAPE SHEET LG 3/4 BI-LAMINATE (DRAPES) ×3 IMPLANT
DRAPE UNDER BUTTOCK W/FLU (DRAPES) ×3 IMPLANT
ELECT REM PT RETURN 9FT ADLT (ELECTROSURGICAL) ×3
ELECTRODE REM PT RTRN 9FT ADLT (ELECTROSURGICAL) ×1 IMPLANT
GAUZE PACK 2X3YD (MISCELLANEOUS) ×3 IMPLANT
GLOVE BIO SURGEON STRL SZ8 (GLOVE) ×3 IMPLANT
GLOVE INDICATOR 8.0 STRL GRN (GLOVE) ×3 IMPLANT
GOWN STRL REUS W/ TWL LRG LVL3 (GOWN DISPOSABLE) ×2 IMPLANT
GOWN STRL REUS W/ TWL XL LVL3 (GOWN DISPOSABLE) ×2 IMPLANT
GOWN STRL REUS W/TWL LRG LVL3 (GOWN DISPOSABLE) ×4
GOWN STRL REUS W/TWL XL LVL3 (GOWN DISPOSABLE) ×4
KIT TURNOVER CYSTO (KITS) ×3 IMPLANT
LABEL OR SOLS (LABEL) ×3 IMPLANT
NS IRRIG 500ML POUR BTL (IV SOLUTION) ×3 IMPLANT
PACK BASIN MINOR ARMC (MISCELLANEOUS) ×3 IMPLANT
PAD PREP 24X41 OB/GYN DISP (PERSONAL CARE ITEMS) ×3 IMPLANT
SUT CHROMIC 2 0 CT 1 (SUTURE) ×18 IMPLANT
SUT VIC AB 0 CT1 36 (SUTURE) ×6 IMPLANT
SUT VIC AB 0 CT2 27 (SUTURE) ×6 IMPLANT
SUT VIC AB 2-0 UR6 27 (SUTURE) ×6 IMPLANT
SYR 10ML LL (SYRINGE) ×3 IMPLANT

## 2017-08-07 NOTE — Anesthesia Post-op Follow-up Note (Signed)
Anesthesia QCDR form completed.        

## 2017-08-07 NOTE — Interval H&P Note (Signed)
History and Physical Interval Note:  08/07/2017 11:25 AM  Katrina Brown  has presented today for surgery, with the diagnosis of MIDLINE CYSTOCELE, URINARY INCONTINENCE  The various methods of treatment have been discussed with the patient and family. After consideration of risks, benefits and other options for treatment, the patient has consented to  Procedure(s): ANTERIOR REPAIR (CYSTOCELE) (N/A) as a surgical intervention .  The patient's history has been reviewed, patient examined, no change in status, stable for surgery.  I have reviewed the patient's chart and labs.  Questions were answered to the patient's satisfaction.     Daphine DeutscherMartin A Dave Mergen

## 2017-08-07 NOTE — Transfer of Care (Signed)
Immediate Anesthesia Transfer of Care Note  Patient: Katrina Brown  Procedure(s) Performed: ANTERIOR REPAIR (CYSTOCELE) (N/A )  Patient Location: PACU  Anesthesia Type:General  Level of Consciousness: sedated  Airway & Oxygen Therapy: Patient Spontanous Breathing and Patient connected to face mask oxygen  Post-op Assessment: Report given to RN and Post -op Vital signs reviewed and stable  Post vital signs: Reviewed and stable  Last Vitals:  Vitals Value Taken Time  BP 121/50 08/07/2017  3:40 PM  Temp 36.6 C 08/07/2017  3:40 PM  Pulse 66 08/07/2017  3:40 PM  Resp 14 08/07/2017  3:40 PM  SpO2 100 % 08/07/2017  3:40 PM  Vitals shown include unvalidated device data.  Last Pain:  Vitals:   08/07/17 1047  TempSrc: Temporal         Complications: No apparent anesthesia complications

## 2017-08-07 NOTE — Anesthesia Preprocedure Evaluation (Signed)
Anesthesia Evaluation  Patient identified by MRN, date of birth, ID band Patient awake    Reviewed: Allergy & Precautions, H&P , NPO status , reviewed documented beta blocker date and time   Airway Mallampati: II  TM Distance: >3 FB     Dental  (+) Teeth Intact   Pulmonary former smoker,    Pulmonary exam normal        Cardiovascular hypertension, Normal cardiovascular exam     Neuro/Psych PSYCHIATRIC DISORDERS Depression negative neurological ROS     GI/Hepatic Neg liver ROS, neg GERD  ,  Endo/Other    Renal/GU Renal disease  negative genitourinary   Musculoskeletal  (+) Arthritis ,   Abdominal   Peds  Hematology negative hematology ROS (+)   Anesthesia Other Findings   Reproductive/Obstetrics                             Anesthesia Physical Anesthesia Plan  ASA: II  Anesthesia Plan: General LMA   Post-op Pain Management:    Induction:   PONV Risk Score and Plan: 4 or greater and Midazolam, Ondansetron, Promethazine and Dexamethasone  Airway Management Planned:   Additional Equipment:   Intra-op Plan:   Post-operative Plan:   Informed Consent: I have reviewed the patients History and Physical, chart, labs and discussed the procedure including the risks, benefits and alternatives for the proposed anesthesia with the patient or authorized representative who has indicated his/her understanding and acceptance.   Dental Advisory Given  Plan Discussed with: CRNA  Anesthesia Plan Comments:         Anesthesia Quick Evaluation

## 2017-08-07 NOTE — Anesthesia Postprocedure Evaluation (Signed)
Anesthesia Post Note  Patient: [name]  Procedure(s) Performed: ANTERIOR REPAIR (CYSTOCELE) (N/A )  Patient location during evaluation: PACU Anesthesia Type: General Level of consciousness: awake and alert Pain management: pain level controlled Vital Signs Assessment: post-procedure vital signs reviewed and stable Respiratory status: spontaneous breathing, nonlabored ventilation, respiratory function stable and patient connected to nasal cannula oxygen Cardiovascular status: blood pressure returned to baseline and stable Postop Assessment: no apparent nausea or vomiting Anesthetic complications: no     Last Vitals:  Vitals:   08/07/17 1830 08/07/17 2020  BP: 125/60 120/61  Pulse: 70 73  Resp: 17 18  Temp: 36.6 C 36.7 C  SpO2: 96% 96%    Last Pain:  Vitals:   08/07/17 2020  TempSrc: Oral  PainSc:                  Lenard SimmerAndrew Murielle Stang

## 2017-08-07 NOTE — Anesthesia Procedure Notes (Signed)
Procedure Name: LMA Insertion Performed by: Kaidence Sant, CRNA Pre-anesthesia Checklist: Patient identified, Patient being monitored, Timeout performed, Emergency Drugs available and Suction available Patient Re-evaluated:Patient Re-evaluated prior to induction Oxygen Delivery Method: Circle system utilized Preoxygenation: Pre-oxygenation with 100% oxygen Induction Type: IV induction Ventilation: Mask ventilation without difficulty LMA: LMA inserted LMA Size: 3.5 Tube type: Oral Number of attempts: 1 Placement Confirmation: positive ETCO2 and breath sounds checked- equal and bilateral Tube secured with: Tape Dental Injury: Teeth and Oropharynx as per pre-operative assessment        

## 2017-08-07 NOTE — Op Note (Signed)
OPERATIVE NOTE:  Katrina Brown PROCEDURE DATE: 08/07/2017   PREOPERATIVE DIAGNOSIS: 1.  Symptomatic cystocele, second-degree POSTOPERATIVE DIAGNOSIS: Symptomatic cystocele, second-degree 2.  Small enterocele PROCEDURE: 1.  Anterior colporrhaphy with Kelly plication 2.  Enterocele repair SURGEON:  Herold HarmsMartin A Moses Odoherty, MD ASSISTANTS: Brennan Baileyavid Evans, MD ANESTHESIA: General INDICATIONS: 72 y.o. female status post hysterectomy the past, status post posterior colporrhaphy with enterocele ligation in the past, presents now for surgical management of symptomatic cystocele.  Patient has been experiencing increasing pelvic pressure with occasional leaking of urine at random times.  No specific stress incontinence has been identified.  Patient does not have significant urgency symptoms.  FINDINGS: First to second-degree cystocele and small enterocele are identified.   I/O's: Total I/O In: 600 [I.V.:600] Out: 100 [Blood:100] COUNTS:  YES SPECIMENS: None ANTIBIOTIC PROPHYLAXIS:N/A COMPLICATIONS: None immediate  PROCEDURE IN DETAIL: Patient brought to the operating room and placed in the supine position.  General endotracheal anesthesia was induced that difficulty.  She is placed in the dorsolithotomy position using the candycane stirrups.  A ChloraPrep and Hibiclens perineal and intravaginal prep and drape was performed in standard fashion. Timeout was completed. Anterior colporrhaphy with Tresa EndoKelly plication was then performed.  Allis clamps were used to grasp lateral margin of the vagina.  A transverse incision is made in the vaginal mucosa.  Metzenbaum scissors were used to undermine the vaginal mucosa with subsequent incising the vaginal mucosa in the midline.  Allis Adair retractors were used to grasp the lateral margins of the vagina for exposure.  This procedure was carried out to within 3 cm of the urethral meatus.  The perivesical fascia was dissected off of the vaginal mucosa through sharp  and blunt dissection.  Once adequately mobilized a small enterocele sac was entered and obliterated with a pursestring suture of 2-0 Vicryl.  Kelly plication sutures were placed in a vertical mattress technique near the urethrovesical junction.  This is followed by reduction of the cystocele using vertical mattress sutures of 2-0 Vicryl.  Following complete reduction of the cystocele, vaginal mucosa is trimmed.  The vagina was then reapproximated midline using 2-0 chromic sutures in a simple interrupted manner.  The vagina was then packed with Kerlix/Premarin gauze.  Patient was then awakened extubated taken recovery room in satisfactory condition.  Bastion Bolger A. Beatris Sie Francesco, MD, [facility]

## 2017-08-08 ENCOUNTER — Encounter: Payer: Self-pay | Admitting: Obstetrics and Gynecology

## 2017-08-08 DIAGNOSIS — N8110 Cystocele, unspecified: Secondary | ICD-10-CM | POA: Diagnosis not present

## 2017-08-08 LAB — RPR: RPR: NONREACTIVE

## 2017-08-08 LAB — HEMOGLOBIN: Hemoglobin: 11 g/dL — ABNORMAL LOW (ref 12.0–16.0)

## 2017-08-08 MED ORDER — DOCUSATE SODIUM 100 MG PO CAPS: 100.0000 mg | ORAL_CAPSULE | Freq: Two times a day (BID) | ORAL | 0 refills | Status: DC

## 2017-08-08 MED ORDER — IBUPROFEN 600 MG PO TABS
600.0000 mg | ORAL_TABLET | Freq: Four times a day (QID) | ORAL | Status: DC
  Administered 2017-08-08: 600 mg via ORAL
  Filled 2017-08-08: qty 1

## 2017-08-08 MED ORDER — IBUPROFEN 800 MG PO TABS: 800.0000 mg | ORAL_TABLET | Freq: Three times a day (TID) | ORAL | 1 refills | Status: DC

## 2017-08-08 MED ORDER — ACETAMINOPHEN 500 MG PO TABS
1000.0000 mg | ORAL_TABLET | Freq: Four times a day (QID) | ORAL | Status: DC
  Administered 2017-08-08: 1000 mg via ORAL
  Filled 2017-08-08: qty 2

## 2017-08-08 MED ORDER — HYDROMORPHONE HCL 2 MG PO TABS: 2.0000 mg | ORAL_TABLET | Freq: Four times a day (QID) | ORAL | Status: DC | PRN

## 2017-08-08 MED ORDER — HYDROMORPHONE HCL 2 MG PO TABS: 2.0000 mg | ORAL_TABLET | Freq: Four times a day (QID) | ORAL | 0 refills | Status: DC | PRN

## 2017-08-08 NOTE — Progress Notes (Signed)
Patient discharged home with husband Discharge instructions and prescriptions given and reviewed with patient. Patient verbalized understanding. Escorted out by auxillary.  

## 2017-08-08 NOTE — Progress Notes (Signed)
1 Day Post-Op Procedure(s) (LRB): ANTERIOR REPAIR (CYSTOCELE) (N/A)  Subjective: Patient reports tolerating PO.    Objective: I have reviewed patient's vital signs, intake and output, medications and labs.  General: alert, cooperative and no distress Resp: reg rate, unlabored Cardio: regular rate and rhythm GI: soft, non-tender; bowel sounds normal; no masses,  no organomegaly Extremities: extremities normal, atraumatic, no cyanosis or edema Vaginal Bleeding: minimal blood on pack-removed CBC Latest Ref Rng & Units 08/08/2017 08/04/2017 06/21/2016  WBC 3.6 - 11.0 K/uL - 7.4 8.6  Hemoglobin 12.0 - 16.0 g/dL 11.0(L) 12.5 12.8  Hematocrit 35.0 - 47.0 % - 37.6 35.5  Platelets 150 - 440 K/uL - 320 299   Assessment: s/p Procedure(s) with comments: ANTERIOR REPAIR (CYSTOCELE) (N/A) - with enterocele ligation and kelly plication: stable and progressing well  Plan: Advance diet Encourage ambulation Advance to PO medication Discontinue IV fluids Discharge homelater today after lunch  LOS: 0 days    Katrina Brown 08/08/2017, 7:58 AM

## 2017-08-08 NOTE — Plan of Care (Signed)
Vs stable; tolerating regular diet; moving well in bed; per MD, he will remove vaginal packing when he rounds in the morning then the nurse can remove the foley catheter; taking iv toradol and needed iv morphine once this shift

## 2017-08-08 NOTE — Discharge Summary (Signed)
Physician Discharge Summary  Patient ID: Katrina Brown MRN: [id number] DOB/AGE: 09/16/1945 72 y.o.  Admit date: 08/07/2017 Discharge date: 08/08/2017  Admission Diagnoses: Cystocele, Symptomatic  Discharge Diagnoses:  SAA; s/p Anterior Colporrhaphy  Operative Procedures: Procedure(s) with comments: ANTERIOR REPAIR (CYSTOCELE) (N/A) - with enterocele ligation and kelly plication  Hospital Course: Uncomplicated.   Significant Diagnostic Studies:  Lab Results  Component Value Date   HGB 11.0 (L) 08/08/2017   HGB 12.5 08/04/2017   HGB 12.8 06/21/2016   Lab Results  Component Value Date   HCT 37.6 08/04/2017   HCT 35.5 06/21/2016   HCT 39.2 05/08/2014   CBC Latest Ref Rng & Units 08/08/2017 08/04/2017 06/21/2016  WBC 3.6 - 11.0 K/uL - 7.4 8.6  Hemoglobin 12.0 - 16.0 g/dL 11.0(L) 12.5 12.8  Hematocrit 35.0 - 47.0 % - 37.6 35.5  Platelets 150 - 440 K/uL - 320 299     Discharged Condition: good  Discharge Exam: Blood pressure (!) 133/56, pulse 79, temperature 98.1 F (36.7 C), temperature source Oral, resp. rate 18, height 5\' 2"  (1.575 m), weight 127 lb (57.6 kg), SpO2 99 %. Incision/Wound: no drainage  Disposition: Discharge disposition: 01-Home or Self Care       Discharge Instructions    Call MD for:  difficulty breathing, headache or visual disturbances   Complete by:  As directed    Call MD for:  extreme fatigue   Complete by:  As directed    Call MD for:  hives   Complete by:  As directed    Call MD for:  persistant nausea and vomiting   Complete by:  As directed    Call MD for:  severe uncontrolled pain   Complete by:  As directed    Call MD for:  temperature >100.4   Complete by:  As directed    Diet - low sodium heart healthy   Complete by:  As directed    Increase activity slowly   Complete by:  As directed    Lifting restrictions   Complete by:  As directed    Nothing heavier than a gallon of milk   Sexual Activity Restrictions   Complete by:  As  directed    Nothing in vagina x 6 weeks.     Allergies as of 08/08/2017      Reactions   Penicillins Other (See Comments)   Unsure of reaction.  Has patient had a PCN reaction causing immediate rash, facial/tongue/throat swelling, SOB or lightheadedness with hypotension:unsure Has patient had a PCN reaction causing severe rash involving mucus membranes or skin necrosis:unsure Has patient had a PCN reaction that required hospitalization:No Has patient had a PCN reaction occurring within the last 10 years:No If all of the above answers are "NO", then may proceed with Cephalosporin use. Childhood reaction.      Medication List    STOP taking these medications   aspirin EC 81 MG tablet     TAKE these medications   Biotin 40981 MCG Tabs Take 10,000 mcg by mouth daily.   bisoprolol-hydrochlorothiazide 5-6.25 MG tablet Commonly known as:  ZIAC Take 1 tablet by mouth every morning.   conjugated estrogens vaginal cream Commonly known as:  PREMARIN Place 1 Applicatorful vaginally at bedtime.   docusate sodium 100 MG capsule Commonly known as:  COLACE Take 1 capsule (100 mg total) by mouth 2 (two) times daily.   estradiol 1 MG tablet Commonly known as:  ESTRACE Take 1 tablet (1 mg total) by mouth daily.  Fish Oil 1000 MG Caps Take 1,000 mg by mouth daily.   HYDROmorphone 2 MG tablet Commonly known as:  DILAUDID Take 1 tablet (2 mg total) by mouth every 6 (six) hours as needed for moderate pain or severe pain.   ibuprofen 800 MG tablet Commonly known as:  ADVIL,MOTRIN Take 1 tablet (800 mg total) by mouth 3 (three) times daily.   Turmeric 500 MG Caps Take 500 mg by mouth daily.   venlafaxine XR 75 MG 24 hr capsule Commonly known as:  EFFEXOR-XR Take 75 mg by mouth daily with breakfast.      Follow-up Information    Samwise Eckardt, Prentice DockerMartin A, MD. Go in 1 week(s).   Specialties:  Obstetrics and Gynecology, Radiology Why:  Post Op check Contact information: [address] [place] [address] [phone]           Signed: Prentice DockerMartin A Contrina Orona 08/08/2017, 11:35 AM

## 2017-08-16 ENCOUNTER — Ambulatory Visit (INDEPENDENT_AMBULATORY_CARE_PROVIDER_SITE_OTHER): Payer: Medicare Other | Admitting: Obstetrics and Gynecology

## 2017-08-16 ENCOUNTER — Encounter: Payer: Self-pay | Admitting: Obstetrics and Gynecology

## 2017-08-16 VITALS — BP 148/69 | HR 65 | Ht 62.0 in | Wt 126.0 lb

## 2017-08-16 DIAGNOSIS — Z09 Encounter for follow-up examination after completed treatment for conditions other than malignant neoplasm: Secondary | ICD-10-CM

## 2017-08-16 DIAGNOSIS — N8111 Cystocele, midline: Secondary | ICD-10-CM

## 2017-08-16 NOTE — Patient Instructions (Signed)
1.  Continue with postop convalescence with limiting activities-not lifting anything heavier than a gallon of milk 2.  Return in 5 weeks for final postop check

## 2017-08-16 NOTE — Progress Notes (Signed)
Chief complaint: 1.  10-day postop check 2.  Status post anterior colporrhaphy with enterocele ligation  Patient presents for initial postop follow-up.  She is doing really well after surgery.  No pelvic pain.  No vaginal discharge, no vaginal bleeding.  Bowel and bladder function are normal.  Pathology: None  OBJECTIVE: BP (!) 148/69   Pulse 65   Ht  (1.575 m)   Wt 126 lb (57.2 kg)   BMI 23.05 kg/m  Physical exam-deferred  ASSESSMENT: 1.  10 days status post anterior colporrhaphy with enterocele ligation for management of midline cystocele-doing well  PLAN: 1.  Continue with routine postop precautions-no lifting anything greater than 8 pounds 2.  Return in 5 weeks for final postop check  Herold Harms, MD  Note: This dictation was prepared with Dragon dictation along with smaller phrase technology. Any transcriptional errors that result from this process are unintentional.

## 2017-09-20 ENCOUNTER — Ambulatory Visit (INDEPENDENT_AMBULATORY_CARE_PROVIDER_SITE_OTHER): Payer: Medicare Other | Admitting: Obstetrics and Gynecology

## 2017-09-20 ENCOUNTER — Encounter: Payer: Self-pay | Admitting: Obstetrics and Gynecology

## 2017-09-20 VITALS — BP 133/66 | HR 69 | Ht 62.0 in | Wt 128.6 lb

## 2017-09-20 DIAGNOSIS — Z09 Encounter for follow-up examination after completed treatment for conditions other than malignant neoplasm: Secondary | ICD-10-CM

## 2017-09-20 NOTE — Patient Instructions (Signed)
1.  Resume activities as tolerated 2.  Return in 1 year for follow-up 3.  Recommend Kegel exercises to help with stress incontinence   Kegel Exercises Kegel exercises help strengthen the muscles that support the rectum, vagina, small intestine, bladder, and uterus. Doing Kegel exercises can help:  Improve bladder and bowel control.  Improve sexual response.  Reduce problems and discomfort during pregnancy.  Kegel exercises involve squeezing your pelvic floor muscles, which are the same muscles you squeeze when you try to stop the flow of urine. The exercises can be done while sitting, standing, or lying down, but it is best to vary your position. Phase 1 exercises 1. Squeeze your pelvic floor muscles tight. You should feel a tight lift in your rectal area. If you are a female, you should also feel a tightness in your vaginal area. Keep your stomach, buttocks, and legs relaxed. 2. Hold the muscles tight for up to 10 seconds. 3. Relax your muscles. Repeat this exercise 50 times a day or as many times as told by your health care provider. Continue to do this exercise for at least 4-6 weeks or for as long as told by your health care provider. This information is not intended to replace advice given to you by your health care provider. Make sure you discuss any questions you have with your health care provider. Document Released: 04/04/2012 Document Revised: 12/12/2015 Document Reviewed: 03/08/2015 Elsevier Interactive Patient Education  [employer].

## 2017-09-20 NOTE — Progress Notes (Signed)
Chief complaint: 1.  Final postop check 2.  Status post anterior colporrhaphy with enterocele ligation 3.  Urine leakage  Retake presents for her final postop check after anterior colporrhaphy with enterocele ligation for symptomatic cystocele with enterocele.  Since surgery she has not had any pelvic pressure symptoms.  She denies pelvic pain. However, she is experiencing some stress incontinence with leaking urine with laughing coughing sneezing that is requiring a mini pad.  At this time she is okay with urinary function and does not desire any intervention for SUI. Bowel function is normal.  Past medical history, past surgical history, problem list, medications, and allergies are reviewed  OBJECTIVE: BP 133/66   Pulse 69   Ht  (1.575 m)   Wt 128 lb 9.6 oz (58.3 kg)   BMI 23.52 kg/m  Pleasant female no acute distress.  Alert and oriented.  Affect is appropriate. Abdomen: Soft, nontender; no organomegaly Pelvic exam: External genitalia-normal BUS-normal Vagina-anterior vagina with minimal redundant mucosa distally at introitus (not surgically corrected); proximal vagina with excellent support; apex of the vagina with excellent support; vaginal depth is 6+ centimeters of good caliber; no rectocele Bimanual-no palpable tenderness or mass; no prolapse with Valsalva Rectovaginal-normal external exam  ASSESSMENT: 1.  Status post anterior colporrhaphy with enterocele ligation for symptomatic cystocele with enterocele; healed with excellent vaginal vault support 2.  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  Resume activities as tolerated 2.  Recommend Kegel exercises 3.  Return in 1 year for follow-up  Herold Harms, MD  Note: This dictation was prepared with Dragon dictation along with smaller phrase technology. Any transcriptional errors that result from this process are unintentional.

## 2018-01-24 ENCOUNTER — Encounter: Payer: Medicare Other | Admitting: Obstetrics and Gynecology

## 2018-02-23 ENCOUNTER — Other Ambulatory Visit: Payer: Self-pay | Admitting: Otolaryngology

## 2018-02-23 DIAGNOSIS — H93A2 Pulsatile tinnitus, left ear: Secondary | ICD-10-CM

## 2018-03-08 ENCOUNTER — Other Ambulatory Visit: Payer: Self-pay | Admitting: Internal Medicine

## 2018-03-08 DIAGNOSIS — Z1231 Encounter for screening mammogram for malignant neoplasm of breast: Secondary | ICD-10-CM

## 2018-03-14 ENCOUNTER — Other Ambulatory Visit
Admission: RE | Admit: 2018-03-14 | Discharge: 2018-03-14 | Disposition: A | Discharge status: Home / Self Care | Payer: Medicare Other | Source: Ambulatory Visit | Attending: Otolaryngology | Admitting: Otolaryngology

## 2018-03-14 ENCOUNTER — Ambulatory Visit
Admission: RE | Admit: 2018-03-14 | Discharge: 2018-03-14 | Disposition: A | Discharge status: Home / Self Care | Payer: Medicare Other | Source: Ambulatory Visit | Attending: Otolaryngology | Admitting: Otolaryngology

## 2018-03-14 ENCOUNTER — Encounter (INDEPENDENT_AMBULATORY_CARE_PROVIDER_SITE_OTHER): Payer: Self-pay

## 2018-03-14 DIAGNOSIS — I672 Cerebral atherosclerosis: Secondary | ICD-10-CM | POA: Diagnosis not present

## 2018-03-14 DIAGNOSIS — H93A2 Pulsatile tinnitus, left ear: Secondary | ICD-10-CM

## 2018-03-14 DIAGNOSIS — I671 Cerebral aneurysm, nonruptured: Secondary | ICD-10-CM | POA: Insufficient documentation

## 2018-03-14 LAB — CREATININE, SERUM: CREATININE: 0.53 mg/dL (ref 0.44–1.00)

## 2018-03-14 MED ORDER — GADOBUTROL 1 MMOL/ML IV SOLN
5.5000 mL | Freq: Once | INTRAVENOUS | Status: AC | PRN
  Administered 2018-03-14: 5.5 mL via INTRAVENOUS

## 2018-03-21 ENCOUNTER — Ambulatory Visit
Admission: RE | Admit: 2018-03-21 | Discharge: 2018-03-21 | Disposition: A | Discharge status: Home / Self Care | Payer: Medicare Other | Source: Ambulatory Visit | Attending: Internal Medicine | Admitting: Internal Medicine

## 2018-03-21 DIAGNOSIS — Z1231 Encounter for screening mammogram for malignant neoplasm of breast: Secondary | ICD-10-CM | POA: Insufficient documentation

## 2018-05-08 DIAGNOSIS — I671 Cerebral aneurysm, nonruptured: Secondary | ICD-10-CM | POA: Insufficient documentation

## 2018-05-09 DIAGNOSIS — H9312 Tinnitus, left ear: Secondary | ICD-10-CM | POA: Insufficient documentation

## 2018-09-25 ENCOUNTER — Encounter: Payer: Medicare Other | Admitting: Obstetrics and Gynecology

## 2019-04-03 ENCOUNTER — Other Ambulatory Visit: Payer: Self-pay | Admitting: Internal Medicine

## 2019-04-03 DIAGNOSIS — Z1231 Encounter for screening mammogram for malignant neoplasm of breast: Secondary | ICD-10-CM

## 2019-04-17 ENCOUNTER — Ambulatory Visit
Admission: RE | Admit: 2019-04-17 | Discharge: 2019-04-17 | Disposition: A | Discharge status: Home / Self Care | Payer: Medicare Other | Source: Ambulatory Visit | Attending: Internal Medicine | Admitting: Internal Medicine

## 2019-04-17 DIAGNOSIS — Z1231 Encounter for screening mammogram for malignant neoplasm of breast: Secondary | ICD-10-CM | POA: Insufficient documentation

## 2019-10-09 ENCOUNTER — Ambulatory Visit
Admission: EM | Admit: 2019-10-09 | Discharge: 2019-10-09 | Disposition: A | Discharge status: Home / Self Care | Payer: Medicare Other | Attending: Family Medicine | Admitting: Family Medicine

## 2019-10-09 ENCOUNTER — Encounter: Payer: Self-pay | Admitting: Emergency Medicine

## 2019-10-09 ENCOUNTER — Other Ambulatory Visit: Payer: Self-pay

## 2019-10-09 ENCOUNTER — Ambulatory Visit (INDEPENDENT_AMBULATORY_CARE_PROVIDER_SITE_OTHER): Payer: Medicare Other

## 2019-10-09 DIAGNOSIS — W19XXXA Unspecified fall, initial encounter: Secondary | ICD-10-CM

## 2019-10-09 DIAGNOSIS — M7918 Myalgia, other site: Secondary | ICD-10-CM | POA: Diagnosis not present

## 2019-10-09 MED ORDER — NAPROXEN 500 MG PO TABS: 500.0000 mg | ORAL_TABLET | Freq: Two times a day (BID) | ORAL | 0 refills | Status: DC | PRN

## 2019-10-09 NOTE — ED Provider Notes (Signed)
[facility]    CSN: [id number] Arrival date & time: 10/09/19  1153  History   Chief Complaint Chief Complaint  Patient presents with   Fall   HPI   74 year old female presents for evaluation after suffering a fall.  Patient reports that she fell after missing the last step on a ladder on 5/31.  Patient ports that she fell backwards and landed on her buttocks.  She reports that she has continued pain particularly at the right buttock.  Denies back pain.  She states that the pain is worse when walking and sitting in certain positions.  No relieving factors. No reported bruising. No other associated symptoms.   Past Medical History:  Diagnosis Date   Arthritis    Cystocele    Depression    History of kidney stones    Hx of mitral valve insufficiency    Hypertension    Insomnia    Menopausal and perimenopausal disorder    Rectocele    Vaginal enterocele    Vaginal Pap smear, abnormal     Patient Active Problem List   Diagnosis Date Noted   Status post anterior colporrhaphy with enterocele ligation 08/16/2017   Clinical depression 12/23/2014   Calculus of kidney 12/23/2014   Chest pain, non-cardiac 12/23/2014   Menopause 12/23/2014   Prolapse of female pelvic organs 12/23/2014   Insomnia, persistent 10/25/2013   Essential (primary) hypertension 10/25/2013   Combined fat and carbohydrate induced hyperlipemia 10/25/2013   MI (mitral incompetence) 10/25/2013   Episode of syncope 10/25/2013    Past Surgical History:  Procedure Laterality Date   ABDOMINAL HYSTERECTOMY  1976   tah/bso   ANTERIOR AND POSTERIOR VAGINAL REPAIR  05/12/2014   APPENDECTOMY     CYSTOCELE REPAIR N/A 08/07/2017   Procedure: ANTERIOR REPAIR (CYSTOCELE);  Surgeon: Herold Harms, MD;  Location: [facility];  Service: Gynecology;  Laterality: N/A;  with enterocele ligation and kelly plication   ENTEROCELE REPAIR  05/2014   KNEE ARTHROSCOPY Left 06/27/2016   Procedure: ARTHROSCOPY LEFT  KNEE, PARTIAL MEDIAL MENISECTOMY, MEDIAL CHONDROPLASTY;  Surgeon: Donato Heinz, MD;  Location: [facility] OR[facility];  Service: Orthopedics;  Laterality: Left;   KNEE SURGERY Right 2010    OB History   No obstetric history on file.      Home Medications    Prior to Admission medications   Medication Sig Start Date End Date Taking? Authorizing Provider  Biotin 67619 MCG TABS Take 10,000 mcg by mouth daily.    Yes [provider]  bisoprolol-hydrochlorothiazide (ZIAC) 5-6.25 MG tablet Take 1 tablet by mouth every morning.    Yes [provider]  conjugated estrogens (PREMARIN) vaginal cream Place 1 Applicatorful vaginally at bedtime.   Yes [provider]  docusate sodium (COLACE) 100 MG capsule Take 1 capsule (100 mg total) by mouth 2 (two) times daily. 08/08/17  Yes Defrancesco, Prentice Docker, MD  estradiol (ESTRACE) 1 MG tablet Take 1 tablet (1 mg total) by mouth daily. 02/23/15  Yes Defrancesco, Prentice Docker, MD  Omega-3 Fatty Acids (FISH OIL) 1000 MG CAPS Take 1,000 mg by mouth daily.   Yes [provider]  Turmeric 500 MG CAPS Take 500 mg by mouth daily.   Yes [provider]  venlafaxine XR (EFFEXOR-XR) 75 MG 24 hr capsule Take 75 mg by mouth daily with breakfast.  04/26/16  Yes [provider]  naproxen (NAPROSYN) 500 MG tablet Take 1 tablet (500 mg total) by mouth 2 (two) times daily as  needed for moderate pain. 10/09/19   Coral Spikes, DO    Family History Family History  Problem Relation Age of Onset   Cancer Neg Hx     Social History Social History   Tobacco Use   Smoking status: Former Smoker    Packs/day: 0.25    Years: 20.00    Pack years: 5.00    Types: Cigarettes    Quit date: 06/20/2008    Years since quitting: 11.3   Smokeless tobacco: Never Used  Substance Use Topics   Alcohol use: Yes    Comment: RED WINE EVERY NIGHT   Drug use: No     Allergies   Penicillins   Review of  Systems Review of Systems  Constitutional: Negative.   Musculoskeletal:       Buttock pain s/p fall.   Physical Exam Triage Vital Signs ED Triage Vitals  Enc Vitals Group     BP 10/09/19 1216 (!) 154/61     Pulse Rate 10/09/19 1216 74     Resp 10/09/19 1216 18     Temp 10/09/19 1216 98.1 F (36.7 C)     Temp Source 10/09/19 1216 Oral     SpO2 10/09/19 1216 98 %     Weight 10/09/19 1214 125 lb (56.7 kg)     Height 10/09/19 1214 5\' 2"  (1.575 m)     Head Circumference --      Peak Flow --      Pain Score 10/09/19 1214 5     Pain Loc --      Pain Edu? --      Excl. in [place]? --    Updated Vital Signs BP (!) 154/61 (BP Location: Left Arm)    Pulse 74    Temp 98.1 F (36.7 C) (Oral)    Resp 18    Ht 5\' 2"  (1.575 m)    Wt 56.7 kg    SpO2 98%    BMI 22.86 kg/m   Visual Acuity Right Eye Distance:   Left Eye Distance:   Bilateral Distance:    Right Eye Near:   Left Eye Near:    Bilateral Near:     Physical Exam Vitals and nursing note reviewed.  Constitutional:      General: She is not in acute distress.    Appearance: Normal appearance. She is not ill-appearing.  HENT:     Head: Normocephalic and atraumatic.  Eyes:     General:        Right eye: No discharge.        Left eye: No discharge.     Conjunctiva/sclera: Conjunctivae normal.  Cardiovascular:     Rate and Rhythm: Normal rate and regular rhythm.     Heart sounds: No murmur.  Pulmonary:     Effort: Pulmonary effort is normal.     Breath sounds: No wheezing, rhonchi or rales.  Musculoskeletal:     Comments: Patient with tenderness of the right buttock.  Neurological:     Mental Status: She is alert.  Psychiatric:        Mood and Affect: Mood normal.        Behavior: Behavior normal.    UC Treatments / Results  Labs (all labs ordered are listed, but only abnormal results are displayed) Labs Reviewed - No data to display  EKG   Radiology DG Hip Unilat W or Wo Pelvis 2-3 Views Right  Result Date:  10/09/2019 CLINICAL DATA:  Right hip pain since a  fall from a ladder 09/30/2018. Initial encounter. EXAM: DG HIP (WITH OR WITHOUT PELVIS) 2-3V RIGHT COMPARISON:  None. FINDINGS: There is no evidence of hip fracture or dislocation. There is no evidence of arthropathy or other focal bone abnormality. IMPRESSION: Negative exam. Electronically Signed   By: Drusilla Kanner M.D.   On: 10/09/2019 12:53    Procedures Procedures (including critical care time)  Medications Ordered in UC Medications - No data to display  Initial Impression / Assessment and Plan / UC Course  I have reviewed the triage vital signs and the nursing notes.  Pertinent labs & imaging results that were available during my care of the patient were reviewed by me and considered in my medical decision making (see chart for details).    74 year old female presents with buttock pain after suffering a fall.  X-ray of the hip and pelvis obtained today.  Independently reviewed by me.  Interpretation: No acute fracture.  Naproxen as directed for pain.  Supportive care.  Final Clinical Impressions(s) / UC Diagnoses   Final diagnoses:  Buttock pain  Fall, initial encounter     Discharge Instructions     Rest.   Medication as needed.  No evidence of fracture.  Take care  Dr. Adriana Simas     ED Prescriptions    Medication Sig Dispense Auth. Provider   naproxen (NAPROSYN) 500 MG tablet Take 1 tablet (500 mg total) by mouth 2 (two) times daily as needed for moderate pain. 30 tablet Tommie Sams, DO     PDMP not reviewed this encounter.   Tommie Sams, [place] 10/09/19 1313

## 2019-10-09 NOTE — Discharge Instructions (Addendum)
Rest.   Medication as needed.  No evidence of fracture.  Take care  Dr. Adriana Simas

## 2019-10-09 NOTE — ED Triage Notes (Signed)
Patient states she fell off of a ladder on 05/31 landing on her buttocks hitting a rock. Patient states she is still having pain with walking and sitting in certain positions.

## 2020-04-06 ENCOUNTER — Other Ambulatory Visit: Payer: Self-pay | Admitting: Internal Medicine

## 2020-04-06 DIAGNOSIS — Z1231 Encounter for screening mammogram for malignant neoplasm of breast: Secondary | ICD-10-CM

## 2020-05-07 ENCOUNTER — Emergency Department: Payer: Medicare Other

## 2020-05-07 ENCOUNTER — Other Ambulatory Visit: Payer: Self-pay

## 2020-05-07 ENCOUNTER — Emergency Department
Admission: EM | Admit: 2020-05-07 | Discharge: 2020-05-07 | Disposition: A | Discharge status: Home / Self Care | Payer: Medicare Other | Attending: Emergency Medicine | Admitting: Emergency Medicine

## 2020-05-07 DIAGNOSIS — Z87891 Personal history of nicotine dependence: Secondary | ICD-10-CM | POA: Diagnosis not present

## 2020-05-07 DIAGNOSIS — I10 Essential (primary) hypertension: Secondary | ICD-10-CM | POA: Insufficient documentation

## 2020-05-07 DIAGNOSIS — R002 Palpitations: Secondary | ICD-10-CM | POA: Insufficient documentation

## 2020-05-07 DIAGNOSIS — Z79899 Other long term (current) drug therapy: Secondary | ICD-10-CM | POA: Diagnosis not present

## 2020-05-07 LAB — CBC
HCT: 38.1 % (ref 36.0–46.0)
Hemoglobin: 12.7 g/dL (ref 12.0–15.0)
MCH: 31.9 pg (ref 26.0–34.0)
MCHC: 33.3 g/dL (ref 30.0–36.0)
MCV: 95.7 fL (ref 80.0–100.0)
Platelets: 316 10*3/uL (ref 150–400)
RBC: 3.98 MIL/uL (ref 3.87–5.11)
RDW: 12.5 % (ref 11.5–15.5)
WBC: 8.9 10*3/uL (ref 4.0–10.5)
nRBC: 0 % (ref 0.0–0.2)

## 2020-05-07 LAB — BASIC METABOLIC PANEL
Anion gap: 11 (ref 5–15)
BUN: 16 mg/dL (ref 8–23)
CO2: 24 mmol/L (ref 22–32)
Calcium: 8.8 mg/dL — ABNORMAL LOW (ref 8.9–10.3)
Chloride: 103 mmol/L (ref 98–111)
Creatinine, Ser: 0.38 mg/dL — ABNORMAL LOW (ref 0.44–1.00)
GFR, Estimated: >60 mL/min (ref >60)
Glucose, Bld: 111 mg/dL — ABNORMAL HIGH (ref 70–99)
Potassium: 4 mmol/L (ref 3.5–5.1)
Sodium: 138 mmol/L (ref 135–145)

## 2020-05-07 LAB — TROPONIN I (HIGH SENSITIVITY)
Troponin I (High Sensitivity): 7 ng/L (ref <18)
Troponin I (High Sensitivity): 8 ng/L (ref <18)

## 2020-05-07 NOTE — Discharge Instructions (Addendum)
Call cardiology for follow up. Discuss may need holter monitor for palpitations. Return to ER for worsening symptoms or other concerns.

## 2020-05-07 NOTE — ED Provider Notes (Addendum)
[facility] Emergency Department Provider Note  ____________________________________________   Event Date/Time   First MD Initiated Contact with Patient 05/07/20 1048     (approximate)  I have reviewed the triage vital signs and the nursing notes.   HISTORY  Chief Complaint Palpitations    HPI Katrina Brown is a 75 y.o. female otherwise healthy who comes in with palpitations. Heart palpitations started last night intermittent for a few hours.Had previously mild palpations years ago but no h/o of arrhythmia. They were intermittent, moderate, nothing made them better or worse.  Reports her husband died in 2023/03/27 and she has been sad 2/2 to that. Denies any SI and has good family support.  No chest pain/sob/fevers.           Past Medical History:  Diagnosis Date  . Arthritis   . Cystocele   . Depression   . History of kidney stones   . Hx of mitral valve insufficiency   . Hypertension   . Insomnia   . Menopausal and perimenopausal disorder   . Rectocele   . Vaginal enterocele   . Vaginal Pap smear, abnormal     Patient Active Problem List   Diagnosis Date Noted  . Status post anterior colporrhaphy with enterocele ligation 08/16/2017  . Clinical depression 12/23/2014  . Calculus of kidney 12/23/2014  . Chest pain, non-cardiac 12/23/2014  . Menopause 12/23/2014  . Prolapse of female pelvic organs 12/23/2014  . Insomnia, persistent 10/25/2013  . Essential (primary) hypertension 10/25/2013  . Combined fat and carbohydrate induced hyperlipemia 10/25/2013  . MI (mitral incompetence) 10/25/2013  . Episode of syncope 10/25/2013    Past Surgical History:  Procedure Laterality Date  . ABDOMINAL HYSTERECTOMY  1976   tah/bso  . ANTERIOR AND POSTERIOR VAGINAL REPAIR  05/12/2014  . APPENDECTOMY    . CYSTOCELE REPAIR N/A 08/07/2017   Procedure: ANTERIOR REPAIR (CYSTOCELE);  Surgeon: Herold Harms, MD;  Location: [facility] ORS;  Service:  Gynecology;  Laterality: N/A;  with enterocele ligation and kelly plication  . ENTEROCELE REPAIR  05/2014  . KNEE ARTHROSCOPY Left 06/27/2016   Procedure: ARTHROSCOPY LEFT  KNEE, PARTIAL MEDIAL MENISECTOMY, MEDIAL CHONDROPLASTY;  Surgeon: Donato Heinz, MD;  Location: [facility] ORS;  Service: Orthopedics;  Laterality: Left;  . KNEE SURGERY Right 2010    Prior to Admission medications   Medication Sig Start Date End Date Taking? Authorizing Provider  Biotin 26712 MCG TABS Take 10,000 mcg by mouth daily.     [provider]  bisoprolol-hydrochlorothiazide (ZIAC) 5-6.25 MG tablet Take 1 tablet by mouth every morning.     [provider]  conjugated estrogens (PREMARIN) vaginal cream Place 1 Applicatorful vaginally at bedtime.    [provider]  docusate sodium (COLACE) 100 MG capsule Take 1 capsule (100 mg total) by mouth 2 (two) times daily. 08/08/17   Defrancesco, Prentice Docker, MD  estradiol (ESTRACE) 1 MG tablet Take 1 tablet (1 mg total) by mouth daily. 02/23/15   Defrancesco, Prentice Docker, MD  naproxen (NAPROSYN) 500 MG tablet Take 1 tablet (500 mg total) by mouth 2 (two) times daily as needed for moderate pain. 10/09/19   Tommie Sams, DO  Omega-3 Fatty Acids (FISH OIL) 1000 MG CAPS Take 1,000 mg by mouth daily.    [provider]  Turmeric 500 MG CAPS Take 500 mg by mouth daily.    [provider]  venlafaxine XR (EFFEXOR-XR) 75 MG 24 hr capsule Take 75 mg by mouth  daily with breakfast.  04/26/16   [provider]    Allergies Penicillins  Family History  Problem Relation Age of Onset  . Cancer Neg Hx     Social History Social History   Tobacco Use  . Smoking status: Former Smoker    Packs/day: 0.25    Years: 20.00    Pack years: 5.00    Types: Cigarettes    Quit date: 06/20/2008    Years since quitting: 11.8  . Smokeless tobacco: Never Used  Vaping Use  . Vaping Use: Never used  Substance Use Topics  . Alcohol use: Yes    Comment:  RED WINE EVERY NIGHT  . Drug use: No      Review of Systems Constitutional: No fever/chills Eyes: No visual changes. ENT: No sore throat. Cardiovascular: Denies chest pain. +palpitations Respiratory: Denies shortness of breath. Gastrointestinal: No abdominal pain.  No nausea, no vomiting.  No diarrhea.  No constipation. Genitourinary: Negative for dysuria. Musculoskeletal: Negative for back pain. Skin: Negative for rash. Neurological: Negative for headaches, focal weakness or numbness. Psych: sad but denies SI All other ROS negative ____________________________________________   PHYSICAL EXAM:  VITAL SIGNS: ED Triage Vitals  Enc Vitals Group     BP 05/07/20 0745 (!) 150/67     Pulse Rate 05/07/20 0745 76     Resp 05/07/20 0745 16     Temp 05/07/20 0745 98.6 F (37 C)     Temp Source 05/07/20 0745 Oral     SpO2 05/07/20 0745 97 %     Weight 05/07/20 0746 122 lb (55.3 kg)     Height 05/07/20 0746 5\' 2"  (1.575 m)     Head Circumference --      Peak Flow --      Pain Score 05/07/20 0746 0     Pain Loc --      Pain Edu? --      Excl. in [place]? --     Constitutional: Alert and oriented. Well appearing and in no acute distress. Eyes: Conjunctivae are normal. EOMI. Head: Atraumatic. Nose: No congestion/rhinnorhea. Mouth/Throat: Mucous membranes are moist.   Neck: No stridor. Trachea Midline. FROM Cardiovascular: Normal rate, regular rhythm. Grossly normal heart sounds.  Good peripheral circulation. Respiratory: Normal respiratory effort.  No retractions. Lungs CTAB. Gastrointestinal: Soft and nontender. No distention. No abdominal bruits.  Musculoskeletal: No lower extremity tenderness nor edema.  No joint effusions. Neurologic:  Normal speech and language. No gross focal neurologic deficits are appreciated.  Skin:  Skin is warm, dry and intact. No rash noted. Psychiatric: Mood and affect are normal. Speech and behavior are normal. Denies SI  GU: Deferred    ____________________________________________   LABS (all labs ordered are listed, but only abnormal results are displayed)  Labs Reviewed  BASIC METABOLIC PANEL - Abnormal; Notable for the following components:      Result Value   Glucose, Bld 111 (*)    Creatinine, Ser 0.38 (*)    Calcium 8.8 (*)    All other components within normal limits  CBC  TROPONIN I (HIGH SENSITIVITY)  TROPONIN I (HIGH SENSITIVITY)   ____________________________________________   ED ECG REPORT I, 07/05/20, the attending physician, personally viewed and interpreted this ECG.  Normal sinus noticed elevation, no T wave version, normal intervals ____________________________________________  RADIOLOGY Katrina Brown, personally viewed and evaluated these images (plain radiographs) as part of my medical decision making, as well as reviewing the written report by the radiologist.  ED MD interpretation: No pneumonia  Official radiology report(s): DG Chest 2 View  Result Date: 05/07/2020 CLINICAL DATA:  Chest pain EXAM: CHEST - 2 VIEW COMPARISON:  None. FINDINGS: The heart size and mediastinal contours are within normal limits. Both lungs are clear. The visualized skeletal structures are unremarkable. IMPRESSION: No active cardiopulmonary disease. Electronically Signed   By: Inez Catalina M.D.   On: 05/07/2020 08:35    ____________________________________________   PROCEDURES  Procedure(s) performed (including Critical Care):  Procedures   ____________________________________________   INITIAL IMPRESSION / ASSESSMENT AND PLAN / ED COURSE  Katrina Brown was evaluated in Emergency Department on 05/07/2020 for the symptoms described in the history of present illness. She was evaluated in the context of the global COVID-19 pandemic, which necessitated consideration that the patient might be at risk for infection with the SARS-CoV-2 virus that causes COVID-19. Institutional protocols and  algorithms that pertain to the evaluation of patients at risk for COVID-19 are in a state of rapid change based on information released by regulatory bodies including the CDC and federal and state organizations. These policies and algorithms were followed during the patient's care in the ED.    Patient presents with palpitations.  EKG without evidence of arrhythmia.  Patient currently asymptomatic.  Cardiac markers negative x2 no evidence of ACS.  No evidence of anemia or electrolyte abnormalities.  Patient is currently asymptomatic at this time.  Discussed with patient that she could have an arrhythmia but this was not caught today.  We will give patient follow-up for cardiology to get Holter monitor.  Patient does report some stress and sadness secondary to loss of her husband.  Denies any SI.  She reports  having good family support to help her through this time.  Given this I feel pt is safe for dc home.    Cardiac trop is negative x2 No anemia.  Electrolytes are normal       ____________________________________________   FINAL CLINICAL IMPRESSION(S) / ED DIAGNOSES   Final diagnoses:  Palpitations      MEDICATIONS GIVEN DURING THIS VISIT:  Medications - No data to display   ED Discharge Orders    None       Note:  This document was prepared using Dragon voice recognition software and may include unintentional dictation errors.   Vanessa Butterfield, MD 05/07/20 1816    Vanessa Damascus, MD 05/07/20 Tresa Moore

## 2020-05-07 NOTE — ED Notes (Signed)
ED Provider at bedside. 

## 2020-05-07 NOTE — ED Triage Notes (Addendum)
Pt comes into the ED via EMS from home with having intermittent palpitations "feeling like my heart is racing" with hot flash, states she lost her husband in November and doesn't know if it related to the stress of that . 200/90, 100%RA, HR 80

## 2020-06-23 ENCOUNTER — Ambulatory Visit
Admission: RE | Admit: 2020-06-23 | Discharge: 2020-06-23 | Disposition: A | Discharge status: Home / Self Care | Payer: Medicare Other | Source: Ambulatory Visit | Attending: Internal Medicine | Admitting: Internal Medicine

## 2020-06-23 ENCOUNTER — Other Ambulatory Visit: Payer: Self-pay

## 2020-06-23 DIAGNOSIS — Z1231 Encounter for screening mammogram for malignant neoplasm of breast: Secondary | ICD-10-CM | POA: Diagnosis not present

## 2020-06-29 ENCOUNTER — Other Ambulatory Visit: Payer: Self-pay | Admitting: Internal Medicine

## 2020-06-29 DIAGNOSIS — N6489 Other specified disorders of breast: Secondary | ICD-10-CM

## 2020-06-29 DIAGNOSIS — R928 Other abnormal and inconclusive findings on diagnostic imaging of breast: Secondary | ICD-10-CM

## 2020-07-15 ENCOUNTER — Other Ambulatory Visit: Payer: Self-pay

## 2020-07-15 ENCOUNTER — Ambulatory Visit
Admission: RE | Admit: 2020-07-15 | Discharge: 2020-07-15 | Disposition: A | Discharge status: Home / Self Care | Payer: Medicare Other | Source: Ambulatory Visit | Attending: Internal Medicine | Admitting: Internal Medicine

## 2020-07-15 DIAGNOSIS — N6489 Other specified disorders of breast: Secondary | ICD-10-CM | POA: Insufficient documentation

## 2020-07-15 DIAGNOSIS — R928 Other abnormal and inconclusive findings on diagnostic imaging of breast: Secondary | ICD-10-CM | POA: Diagnosis not present

## 2020-09-15 DIAGNOSIS — E559 Vitamin D deficiency, unspecified: Secondary | ICD-10-CM | POA: Insufficient documentation

## 2021-02-26 ENCOUNTER — Emergency Department: Payer: Medicare Other

## 2021-02-26 ENCOUNTER — Emergency Department
Admission: EM | Admit: 2021-02-26 | Discharge: 2021-02-26 | Disposition: A | Discharge status: Home / Self Care | Payer: Medicare Other | Attending: Emergency Medicine | Admitting: Emergency Medicine

## 2021-02-26 ENCOUNTER — Other Ambulatory Visit: Payer: Self-pay

## 2021-02-26 ENCOUNTER — Encounter: Payer: Self-pay | Admitting: Emergency Medicine

## 2021-02-26 DIAGNOSIS — Z87891 Personal history of nicotine dependence: Secondary | ICD-10-CM | POA: Diagnosis not present

## 2021-02-26 DIAGNOSIS — R002 Palpitations: Secondary | ICD-10-CM | POA: Diagnosis not present

## 2021-02-26 DIAGNOSIS — Z79899 Other long term (current) drug therapy: Secondary | ICD-10-CM | POA: Insufficient documentation

## 2021-02-26 DIAGNOSIS — F419 Anxiety disorder, unspecified: Secondary | ICD-10-CM | POA: Diagnosis not present

## 2021-02-26 DIAGNOSIS — I10 Essential (primary) hypertension: Secondary | ICD-10-CM | POA: Diagnosis not present

## 2021-02-26 LAB — BASIC METABOLIC PANEL
Anion gap: 9 (ref 5–15)
BUN: 18 mg/dL (ref 8–23)
CO2: 26 mmol/L (ref 22–32)
Calcium: 8.9 mg/dL (ref 8.9–10.3)
Chloride: 101 mmol/L (ref 98–111)
Creatinine, Ser: 0.41 mg/dL — ABNORMAL LOW (ref 0.44–1.00)
GFR, Estimated: >60 mL/min (ref >60)
Glucose, Bld: 102 mg/dL — ABNORMAL HIGH (ref 70–99)
Potassium: 3.5 mmol/L (ref 3.5–5.1)
Sodium: 136 mmol/L (ref 135–145)

## 2021-02-26 LAB — CBC
HCT: 37.1 % (ref 36.0–46.0)
Hemoglobin: 13 g/dL (ref 12.0–15.0)
MCH: 33.1 pg (ref 26.0–34.0)
MCHC: 35 g/dL (ref 30.0–36.0)
MCV: 94.4 fL (ref 80.0–100.0)
Platelets: 281 10*3/uL (ref 150–400)
RBC: 3.93 MIL/uL (ref 3.87–5.11)
RDW: 12.8 % (ref 11.5–15.5)
WBC: 7.8 10*3/uL (ref 4.0–10.5)
nRBC: 0 % (ref 0.0–0.2)

## 2021-02-26 LAB — MAGNESIUM: Magnesium: 1.9 mg/dL (ref 1.7–2.4)

## 2021-02-26 LAB — TROPONIN I (HIGH SENSITIVITY): Troponin I (High Sensitivity): 7 ng/L (ref <18)

## 2021-02-26 NOTE — ED Notes (Signed)
Dc instructions reviewed with pt no questions or concerns at this time. Will follow up with pcp. Pt wheeled out of the ED

## 2021-02-26 NOTE — ED Triage Notes (Signed)
EMS brings pt in for c/o palpitations x 4hrs; no pain, hx HTN

## 2021-02-26 NOTE — ED Provider Notes (Signed)
[facility] Emergency Department Provider Note   ____________________________________________   Event Date/Time   First MD Initiated Contact with Patient 02/26/21 0915     (approximate)  I have reviewed the triage vital signs and the nursing notes.   HISTORY  Chief Complaint Palpitations    HPI Katrina Brown is a 75 y.o. female with past medical history of hypertension who presents to the ED complaining of palpitations.  Patient reports that she woke up around 4:00 this morning and felt like her heart was racing at that time.  She states she could hear her heartbeat in her ears, but she denies any associated chest pain or shortness of breath.  She has had similar episodes in the past, seen by Dr. Welton Flakes of cardiology at that time.  She had unremarkable work-up and was counseled to take an extra of her blood pressure medicine if it were to happen again.  She took an extra dose of Ziac, but continued to feel like her heart was racing and so decided to come to the ED for evaluation.  She states that symptoms improved shortly after arrival, has not had any further episodes of palpitations since then.  She does state that she has been under significant stress lately after her husband passed away around this time last year.        Past Medical History:  Diagnosis Date   Arthritis    Cystocele    Depression    History of kidney stones    Hx of mitral valve insufficiency    Hypertension    Insomnia    Menopausal and perimenopausal disorder    Rectocele    Vaginal enterocele    Vaginal Pap smear, abnormal     Patient Active Problem List   Diagnosis Date Noted   Status post anterior colporrhaphy with enterocele ligation 08/16/2017   Clinical depression 12/23/2014   Calculus of kidney 12/23/2014   Chest pain, non-cardiac 12/23/2014   Menopause 12/23/2014   Prolapse of female pelvic organs 12/23/2014   Insomnia, persistent 10/25/2013   Essential  (primary) hypertension 10/25/2013   Combined fat and carbohydrate induced hyperlipemia 10/25/2013   MI (mitral incompetence) 10/25/2013   Episode of syncope 10/25/2013    Past Surgical History:  Procedure Laterality Date   ABDOMINAL HYSTERECTOMY  1976   tah/bso   ANTERIOR AND POSTERIOR VAGINAL REPAIR  05/12/2014   APPENDECTOMY     CYSTOCELE REPAIR N/A 08/07/2017   Procedure: ANTERIOR REPAIR (CYSTOCELE);  Surgeon: Herold Harms, MD;  Location: [facility] ORS;  Service: Gynecology;  Laterality: N/A;  with enterocele ligation and kelly plication   ENTEROCELE REPAIR  05/2014   KNEE ARTHROSCOPY Left 06/27/2016   Procedure: ARTHROSCOPY LEFT  KNEE, PARTIAL MEDIAL MENISECTOMY, MEDIAL CHONDROPLASTY;  Surgeon: Donato Heinz, MD;  Location: [facility] ORS;  Service: Orthopedics;  Laterality: Left;   KNEE SURGERY Right 2010    Prior to Admission medications   Medication Sig Start Date End Date Taking? Authorizing Provider  Biotin 10258 MCG TABS Take 10,000 mcg by mouth daily.     [provider]  bisoprolol-hydrochlorothiazide (ZIAC) 5-6.25 MG tablet Take 1 tablet by mouth every morning.     [provider]  conjugated estrogens (PREMARIN) vaginal cream Place 1 Applicatorful vaginally at bedtime.    [provider]  docusate sodium (COLACE) 100 MG capsule Take 1 capsule (100 mg total) by mouth 2 (two) times daily. 08/08/17   Defrancesco, Prentice Docker, MD  estradiol (ESTRACE) 1  MG tablet Take 1 tablet (1 mg total) by mouth daily. 02/23/15   Defrancesco, Prentice Docker, MD  naproxen (NAPROSYN) 500 MG tablet Take 1 tablet (500 mg total) by mouth 2 (two) times daily as needed for moderate pain. 10/09/19   Tommie Sams, DO  Omega-3 Fatty Acids (FISH OIL) 1000 MG CAPS Take 1,000 mg by mouth daily.    [provider]  Turmeric 500 MG CAPS Take 500 mg by mouth daily.    [provider]  venlafaxine XR (EFFEXOR-XR) 75 MG 24 hr capsule Take 75 mg by mouth daily with breakfast.   04/26/16   [provider]    Allergies Penicillins  Family History  Problem Relation Age of Onset   Cancer Neg Hx     Social History Social History   Tobacco Use   Smoking status: Former    Packs/day: 0.25    Years: 20.00    Pack years: 5.00    Types: Cigarettes    Quit date: 06/20/2008    Years since quitting: 12.6   Smokeless tobacco: Never  Vaping Use   Vaping Use: Never used  Substance Use Topics   Alcohol use: Yes    Comment: RED WINE EVERY NIGHT   Drug use: No    Review of Systems  Constitutional: No fever/chills Eyes: No visual changes. ENT: No sore throat. Cardiovascular: Denies chest pain.  Positive for palpitations. Respiratory: Denies shortness of breath. Gastrointestinal: No abdominal pain.  No nausea, no vomiting.  No diarrhea.  No constipation. Genitourinary: Negative for dysuria. Musculoskeletal: Negative for back pain. Skin: Negative for rash. Neurological: Negative for headaches, focal weakness or numbness.  ____________________________________________   PHYSICAL EXAM:  VITAL SIGNS: ED Triage Vitals  Enc Vitals Group     BP 02/26/21 0706 (!) 143/61     Pulse Rate 02/26/21 0706 61     Resp 02/26/21 0706 16     Temp 02/26/21 0706 98.1 F (36.7 C)     Temp Source 02/26/21 0706 Oral     SpO2 02/26/21 0643 98 %     Weight 02/26/21 0704 121 lb 14.6 oz (55.3 kg)     Height 02/26/21 0704 5\' 2"  (1.575 m)     Head Circumference --      Peak Flow --      Pain Score 02/26/21 0704 0     Pain Loc --      Pain Edu? --      Excl. in GC? --     Constitutional: Alert and oriented. Eyes: Conjunctivae are normal. Head: Atraumatic. Nose: No congestion/rhinnorhea. Mouth/Throat: Mucous membranes are moist. Neck: Normal ROM Cardiovascular: Normal rate, regular rhythm. Grossly normal heart sounds.  2+ radial pulses bilaterally. Respiratory: Normal respiratory effort.  No retractions. Lungs CTAB. Gastrointestinal: Soft and nontender. No  distention. Genitourinary: deferred Musculoskeletal: No lower extremity tenderness nor edema. Neurologic:  Normal speech and language. No gross focal neurologic deficits are appreciated. Skin:  Skin is warm, dry and intact. No rash noted. Psychiatric: Mood and affect are normal. Speech and behavior are normal.  ____________________________________________   LABS (all labs ordered are listed, but only abnormal results are displayed)  Labs Reviewed  BASIC METABOLIC PANEL - Abnormal; Notable for the following components:      Result Value   Glucose, Bld 102 (*)    Creatinine, Ser 0.41 (*)    All other components within normal limits  CBC  MAGNESIUM  TROPONIN I (HIGH SENSITIVITY)   ____________________________________________  EKG  ED ECG REPORT I, Chesley Noon, the attending physician, personally viewed and interpreted this ECG.   Date: 02/26/2021  EKG Time: 7:13  Rate: 62  Rhythm: normal EKG, normal sinus rhythm, unchanged from previous tracings  Axis: Normal  Intervals:none  ST&T Change: None    PROCEDURES  Procedure(s) performed (including Critical Care):  Procedures   ____________________________________________   INITIAL IMPRESSION / ASSESSMENT AND PLAN / ED COURSE      75 year old female with past medical history of hypertension who presents to the ED complaining of episode of palpitations around 4 AM this morning that has since resolved with no associated chest pain or shortness of breath.  EKG shows no evidence of arrhythmia or ischemia and labs are unremarkable, troponin within normal limits.  Chest x-ray reviewed by me and shows no infiltrate, edema, or effusion.  I do suspect there is a component of anxiety as patient reports significant stress lately.  She is appropriate for discharge home with PCP follow-up, was counseled to return to the ED for new worsening symptoms, patient and son agree with plan.       ____________________________________________   FINAL CLINICAL IMPRESSION(S) / ED DIAGNOSES  Final diagnoses:  Palpitations  Anxiety     ED Discharge Orders     None        Note:  This document was prepared using Dragon voice recognition software and may include unintentional dictation errors.    Chesley Noon, MD 02/26/21 1004

## 2021-02-26 NOTE — ED Provider Notes (Signed)
Emergency Medicine Provider Triage Evaluation Note  Katrina Brown , a 75 y.o. female  was evaluated in triage.  Pt complains of palpitations and sensation of heart racing since 4:00 this morning intermittently.  She has not had any associated chest pain or shortness of breath, denies any fevers or cough.  Review of Systems  Positive: Palpitations Negative: Chest pain, shortness of breath, fevers, cough, vomiting, diarrhea.  Physical Exam  BP (!) 143/61 (BP Location: Left Arm)   Pulse 61   Temp 98.1 F (36.7 C) (Oral)   Resp 16   Ht 5\' 2"  (1.575 m)   Wt 55.3 kg   SpO2 98%   BMI 22.30 kg/m  Gen:   Awake, no distress Resp:  Normal effort, clear to auscultation bilaterally. MSK:   Moves extremities without difficulty, 2+ radial pulses bilaterally. Other:  Regular rate and rhythm, no murmurs, rubs, or gallops.  Medical Decision Making  Medically screening exam initiated at 7:11 AM.  Appropriate orders placed.  Gillian Fotheringham was informed that the remainder of the evaluation will be completed by another provider, this initial triage assessment does not replace that evaluation, and the importance of remaining in the ED until their evaluation is complete.   , MD 02/26/21 [phone]

## 2021-05-11 ENCOUNTER — Encounter: Payer: Self-pay | Admitting: Podiatry

## 2021-05-11 ENCOUNTER — Other Ambulatory Visit: Payer: Self-pay

## 2021-05-11 ENCOUNTER — Telehealth: Payer: Self-pay | Admitting: *Deleted

## 2021-05-11 ENCOUNTER — Ambulatory Visit: Payer: Medicare Other | Admitting: Podiatry

## 2021-05-11 DIAGNOSIS — L600 Ingrowing nail: Secondary | ICD-10-CM

## 2021-05-11 MED ORDER — GENTAMICIN SULFATE 0.1 % EX CREA: 1.0000 "application " | TOPICAL_CREAM | Freq: Two times a day (BID) | CUTANEOUS | 1 refills | Status: DC

## 2021-05-11 NOTE — Telephone Encounter (Signed)
"  I called earlier about my toe bleeding a little bit.  It has all stopped and the bandage comes off tomorrow.  I'm sorry to bother you."

## 2021-05-11 NOTE — Telephone Encounter (Signed)
"  I was there this morning for an ingrown toenail.  One has been bleeding, the gauze that he put on there, the whole bottom half.   I'm keeping a paper towel around it.  Is that okay?"

## 2021-05-11 NOTE — Progress Notes (Signed)
° °  Subjective: Patient presents today for evaluation of pain to the bilateral great toes. Patient is concerned for possible ingrown nail.  It is very sensitive to touch.  Patient presents today for further treatment and evaluation.  Past Medical History:  Diagnosis Date   Arthritis    Cystocele    Depression    History of kidney stones    Hx of mitral valve insufficiency    Hypertension    Insomnia    Menopausal and perimenopausal disorder    Rectocele    Vaginal enterocele    Vaginal Pap smear, abnormal     Objective:  General: Well developed, nourished, in no acute distress, alert and oriented x3   Dermatology: Skin is warm, dry and supple bilateral.  Medial and lateral border of the right great toe as well as the lateral border of the left great toe appears to be erythematous with evidence of an ingrowing nail. Pain on palpation noted to the border of the nail fold. The remaining nails appear unremarkable at this time. There are no open sores, lesions.  Vascular: Dorsalis Pedis artery and Posterior Tibial artery pedal pulses palpable. No lower extremity edema noted.   Neruologic: Grossly intact via light touch bilateral.  Musculoskeletal: Muscular strength within normal limits in all groups bilateral. Normal range of motion noted to all pedal and ankle joints.   Assesement: #1 Paronychia with ingrowing nail RT med + lat. LT lat #2 Pain in toe  Plan of Care:  1. Patient evaluated.  2. Discussed treatment alternatives and plan of care. Explained nail avulsion procedure and post procedure course to patient. 3. Patient opted for permanent partial nail avulsion of the ingrown portion of the nail.  4. Prior to procedure, local anesthesia infiltration utilized using 3 ml of a 50:50 mixture of 2% plain lidocaine and 0.5% plain marcaine in a normal hallux block fashion and a betadine prep performed.  5. Partial permanent nail avulsion with chemical matrixectomy performed using  3x30sec applications of phenol followed by alcohol flush.  6. Light dressing applied.  Post care instructions provided 7.  Prescription for gentamicin 2% cream  8.  Return to clinic 2 weeks.  Felecia Shelling, DPM [facility]  Dr. Felecia Shelling, DPM    [address] [place], [place] [address]                Office [phone]  Fax [phone]

## 2021-05-11 NOTE — Patient Instructions (Signed)

## 2021-05-25 ENCOUNTER — Other Ambulatory Visit: Payer: Self-pay

## 2021-05-25 ENCOUNTER — Ambulatory Visit: Payer: Medicare Other | Admitting: Podiatry

## 2021-05-25 ENCOUNTER — Encounter: Payer: Self-pay | Admitting: Podiatry

## 2021-05-25 DIAGNOSIS — L600 Ingrowing nail: Secondary | ICD-10-CM | POA: Diagnosis not present

## 2021-05-25 MED ORDER — DOXYCYCLINE HYCLATE 100 MG PO TABS: 100.0000 mg | ORAL_TABLET | Freq: Two times a day (BID) | ORAL | 0 refills | Status: DC

## 2021-05-25 NOTE — Progress Notes (Signed)
° °  Subjective: 76 y.o. female presents today status post permanent nail avulsion procedure of the medial and lateral border of the right great toe as well as the medial border of the left great toe that was performed on 05/11/2021.  Patient states that she is doing well.  She notices a significant reduction in the pain.  She has been soaking her foot and applying antibiotic cream as instructed..   Past Medical History:  Diagnosis Date   Arthritis    Cystocele    Depression    History of kidney stones    Hx of mitral valve insufficiency    Hypertension    Insomnia    Menopausal and perimenopausal disorder    Rectocele    Vaginal enterocele    Vaginal Pap smear, abnormal    Past Surgical History:  Procedure Laterality Date   ABDOMINAL HYSTERECTOMY  1976   tah/bso   ANTERIOR AND POSTERIOR VAGINAL REPAIR  05/12/2014   APPENDECTOMY     CYSTOCELE REPAIR N/A 08/07/2017   Procedure: ANTERIOR REPAIR (CYSTOCELE);  Surgeon: Herold Harms, MD;  Location: [facility];  Service: Gynecology;  Laterality: N/A;  with enterocele ligation and kelly plication   ENTEROCELE REPAIR  05/2014   KNEE ARTHROSCOPY Left 06/27/2016   Procedure: ARTHROSCOPY LEFT  KNEE, PARTIAL MEDIAL MENISECTOMY, MEDIAL CHONDROPLASTY;  Surgeon: Donato Heinz, MD;  Location: [facility];  Service: Orthopedics;  Laterality: Left;   KNEE SURGERY Right 2010   Allergies  Allergen Reactions   Penicillins Other (See Comments)    Unsure of reaction.  Has patient had a PCN reaction causing immediate rash, facial/tongue/throat swelling, SOB or lightheadedness with hypotension:unsure Has patient had a PCN reaction causing severe rash involving mucus membranes or skin necrosis:unsure Has patient had a PCN reaction that required hospitalization:No Has patient had a PCN reaction occurring within the last 10 years:No If all of the above answers are "NO", then may proceed with Cephalosporin use. Childhood reaction.    Objective: Skin is  warm, dry and supple. Nail and respective nail fold appears to be healing appropriately. Open wound to the associated nail fold with a granular wound base and moderate amount of fibrotic tissue. Minimal drainage noted. Mild erythema around the periungual region likely due to phenol chemical matricectomy.  Assessment: #1 s/p partial permanent nail matrixectomy medial and lateral border of the right great toe as well as the medial border of the left great toe   Plan of care: #1 patient was evaluated  #2 light debridement of open wound was performed to the periungual border of the respective toe using a currette. Antibiotic ointment and Band-Aid was applied. #3 due to the slight erythema around the time especially the right great toe I will prescribe some doxycycline 100 mg 2 times daily #14 sent to pharmacy #4 continue gentamicin cream for an additional week and soaking #5 return to clinic in 3 weeks  Felecia Shelling, DPM [facility]  Dr. Felecia Shelling, DPM    [address] [place], [place] [address]                Office [phone]  Fax [phone]

## 2021-06-02 ENCOUNTER — Telehealth: Payer: Self-pay | Admitting: *Deleted

## 2021-06-02 NOTE — Telephone Encounter (Signed)
"  I finished my antibiotic yesterday.  My toe is still red.  Do I need to do another round of antibiotics.  If so, can you send it in to my pharmacy?"

## 2021-06-03 NOTE — Telephone Encounter (Signed)
I'm calling to let you know that Dr. Logan Bores doesn't want to call in another refill for the antibiotic but he wants you to come in to see him on tomorrow.  "Okay, what time?"  He can see you at 10:15 am.  "That will be fine."

## 2021-06-04 ENCOUNTER — Encounter: Payer: Self-pay | Admitting: Podiatry

## 2021-06-04 ENCOUNTER — Ambulatory Visit: Payer: Medicare Other | Admitting: Podiatry

## 2021-06-04 ENCOUNTER — Other Ambulatory Visit: Payer: Self-pay

## 2021-06-04 DIAGNOSIS — L600 Ingrowing nail: Secondary | ICD-10-CM | POA: Diagnosis not present

## 2021-06-04 NOTE — Progress Notes (Signed)
Subjective: 76 y.o. female presents today status post permanent nail avulsion procedure of the medial and lateral border of the right great toe as well as the medial border of the left great toe that was performed on 05/11/2021.  The left great toe is doing very well and healing appropriately.  The right great toe continues to have redness with swelling and edema especially across the base of the nail plate.  Last visit she was placed on antibiotics doxycycline 100 mg #14 for a week and she continues to apply the gentamicin cream and soak her foot.  She continues to have some tenderness associated to the swelling and edema of the toe.    Past Medical History:  Diagnosis Date   Arthritis    Cystocele    Depression    History of kidney stones    Hx of mitral valve insufficiency    Hypertension    Insomnia    Menopausal and perimenopausal disorder    Rectocele    Vaginal enterocele    Vaginal Pap smear, abnormal    Past Surgical History:  Procedure Laterality Date   ABDOMINAL HYSTERECTOMY  1976   [place]/bso   ANTERIOR AND POSTERIOR VAGINAL REPAIR  05/12/2014   APPENDECTOMY     CYSTOCELE REPAIR N/A 08/07/2017   Procedure: ANTERIOR REPAIR (CYSTOCELE);  Surgeon: Brayton Mars, MD;  Location: [facility];  Service: Gynecology;  Laterality: N/A;  with enterocele ligation and kelly plication   ENTEROCELE REPAIR  05/2014   KNEE ARTHROSCOPY Left 06/27/2016   Procedure: ARTHROSCOPY LEFT  KNEE, PARTIAL MEDIAL MENISECTOMY, MEDIAL CHONDROPLASTY;  Surgeon: Dereck Leep, MD;  Location: [facility];  Service: Orthopedics;  Laterality: Left;   KNEE SURGERY Right 2010   Allergies  Allergen Reactions   Penicillins Other (See Comments)    Unsure of reaction.  Has patient had a PCN reaction causing immediate rash, facial/tongue/throat swelling, SOB or lightheadedness with hypotension:unsure Has patient had a PCN reaction causing severe rash involving mucus membranes or skin necrosis:unsure Has patient had  a PCN reaction that required hospitalization:No Has patient had a PCN reaction occurring within the last 10 years:No If all of the above answers are "NO", then may proceed with Cephalosporin use. Childhood reaction.    Objective: There continues to be some erythema and edema noted to the right great toe especially across the entire base of the nail plate.  Associated tenderness to palpation.  Left great toe is doing very well with routine healing.  Assessment: #1 s/p partial permanent nail matrixectomy medial and lateral border of the right great toe as well as the medial border of the left great toe #2 recurrent paronychia with cellulitis right great toe  Plan of care: #1 patient was evaluated  #2  Unfortunately the patient continues to have some redness and swelling to the area.  I do believe it would be in the patient's best interest to perform a total temporary nail avulsion of the right hallux nail plate to ensure that there is no underlying infection of the nail plate.  Patient agrees and would like to have the toenail temporarily removed #3 the toe was prepped in aseptic manner and digital block performed using 3 mL of 2% lidocaine plain #4 the nail was avulsed in its entirety and a light dressing applied #5 continue post care instructions including Epsom salt soaks and gentamicin cream #6 return to clinic in 2 weeks  Edrick Kins, DPM [facility]  Dr. Ruby Cola  Carver Fila, DPM    [address], [place] [address]                Office [phone]  Fax [phone]

## 2021-06-07 ENCOUNTER — Telehealth: Payer: Self-pay | Admitting: *Deleted

## 2021-06-07 NOTE — Telephone Encounter (Signed)
"  Friday, Dr. Logan Bores removed my toenail.  I think he said he was going to call in an antibiotic for me to take.  [facility] didn't have a record of that."

## 2021-06-09 ENCOUNTER — Telehealth: Payer: Self-pay | Admitting: *Deleted

## 2021-06-09 NOTE — Telephone Encounter (Signed)
I don't think additional meds were supposed to be sent

## 2021-06-09 NOTE — Telephone Encounter (Signed)
Patient called stating she thought Dr. Logan Bores was going to send in an antibiotic for her toe. [facility] pharmacy didn't have anything. Please advise.   ~I checked chart - doxy sent in 1/24 and gentamycin cream prior to that, but no recent antibiotics~ AP

## 2021-06-18 ENCOUNTER — Encounter: Payer: Self-pay | Admitting: Podiatry

## 2021-06-18 ENCOUNTER — Ambulatory Visit: Payer: Medicare Other | Admitting: Podiatry

## 2021-06-18 ENCOUNTER — Other Ambulatory Visit: Payer: Self-pay

## 2021-06-18 DIAGNOSIS — L600 Ingrowing nail: Secondary | ICD-10-CM | POA: Diagnosis not present

## 2021-06-18 NOTE — Progress Notes (Signed)
° °  HPI: 76 y.o. female presenting today status post total temporary nail avulsion to the right hallux nail plate on 6/0/6301.  Patient states that she is feeling much better.  She says that the pain has resolved completely.  She no longer has any issues associated to the toe.  She has been soaking her foot and applying the gentamicin cream.  No new complaints at this time  Past Medical History:  Diagnosis Date   Arthritis    Cystocele    Depression    History of kidney stones    Hx of mitral valve insufficiency    Hypertension    Insomnia    Menopausal and perimenopausal disorder    Rectocele    Vaginal enterocele    Vaginal Pap smear, abnormal     Past Surgical History:  Procedure Laterality Date   ABDOMINAL HYSTERECTOMY  1976   [place]/bso   ANTERIOR AND POSTERIOR VAGINAL REPAIR  05/12/2014   APPENDECTOMY     CYSTOCELE REPAIR N/A 08/07/2017   Procedure: ANTERIOR REPAIR (CYSTOCELE);  Surgeon: Herold Harms, MD;  Location: [facility];  Service: Gynecology;  Laterality: N/A;  with enterocele ligation and kelly plication   ENTEROCELE REPAIR  05/2014   KNEE ARTHROSCOPY Left 06/27/2016   Procedure: ARTHROSCOPY LEFT  KNEE, PARTIAL MEDIAL MENISECTOMY, MEDIAL CHONDROPLASTY;  Surgeon: Donato Heinz, MD;  Location: [facility];  Service: Orthopedics;  Laterality: Left;   KNEE SURGERY Right 2010    Allergies  Allergen Reactions   Penicillins Other (See Comments)    Unsure of reaction.  Has patient had a PCN reaction causing immediate rash, facial/tongue/throat swelling, SOB or lightheadedness with hypotension:unsure Has patient had a PCN reaction causing severe rash involving mucus membranes or skin necrosis:unsure Has patient had a PCN reaction that required hospitalization:No Has patient had a PCN reaction occurring within the last 10 years:No If all of the above answers are "NO", then may proceed with Cephalosporin use. Childhood reaction.     Physical Exam: General: The patient is  alert and oriented x3 in no acute distress.  Dermatology: Skin is warm, dry and supple bilateral lower extremities. Negative for open lesions or macerations.  Absence of the right hallux nail plate noted.  There is no drainage.  Good healing of the nailbed and the tissue around the base of the nail plate.  Vascular: Palpable pedal pulses bilaterally. Capillary refill within normal limits.  Negative for any significant edema or erythema  Neurological: Light touch and protective threshold grossly intact  Musculoskeletal Exam: No pedal deformities noted   Assessment: 1.  Status post total temporary nail avulsion right hallux nail plate.  06/04/2021   Plan of Care:  1. Patient evaluated.  2.  Continue gentamicin cream over the area daily for an additional week or 2 3.  Patient may resume full activity no restrictions 4.  Return to clinic as needed     Felecia Shelling, DPM [facility]  Dr. Felecia Shelling, DPM    [address] [place], [place] [address]                Office [phone]  Fax [phone]

## 2021-07-15 ENCOUNTER — Other Ambulatory Visit: Payer: Self-pay | Admitting: Internal Medicine

## 2021-08-23 ENCOUNTER — Ambulatory Visit
Admission: RE | Admit: 2021-08-23 | Discharge: 2021-08-23 | Disposition: A | Discharge status: Home / Self Care | Payer: Medicare Other | Source: Ambulatory Visit | Attending: Internal Medicine | Admitting: Internal Medicine

## 2021-08-23 DIAGNOSIS — Z1231 Encounter for screening mammogram for malignant neoplasm of breast: Secondary | ICD-10-CM | POA: Insufficient documentation

## 2021-10-19 DIAGNOSIS — R7303 Prediabetes: Secondary | ICD-10-CM | POA: Insufficient documentation

## 2022-04-10 DIAGNOSIS — M1712 Unilateral primary osteoarthritis, left knee: Secondary | ICD-10-CM | POA: Insufficient documentation

## 2022-04-13 ENCOUNTER — Other Ambulatory Visit: Payer: Self-pay | Admitting: Internal Medicine

## 2022-04-13 DIAGNOSIS — Z1231 Encounter for screening mammogram for malignant neoplasm of breast: Secondary | ICD-10-CM

## 2022-05-02 NOTE — Discharge Instructions (Signed)
Instructions after Total Knee Replacement   Deshonda Cryderman P. Lamberto Dinapoli, Jr., M.D.     Dept. of Orthopaedics & Sports Medicine  [facility]  [address]  [place], [place]  [address]  Phone: [phone]   Fax: [phone]    DIET: Drink plenty of non-alcoholic fluids. Resume your normal diet. Include foods high in fiber.  ACTIVITY:  You may use crutches or a walker with weight-bearing as tolerated, unless instructed otherwise. You may be weaned off of the walker or crutches by your Physical Therapist.  Do NOT place pillows under the knee. Anything placed under the knee could limit your ability to straighten the knee.   Continue doing gentle exercises. Exercising will reduce the pain and swelling, increase motion, and prevent muscle weakness.   Please continue to use the TED compression stockings for 6 weeks. You may remove the stockings at night, but should reapply them in the morning. Do not drive or operate any equipment until instructed.  WOUND CARE:  Continue to use the PolarCare or ice packs periodically to reduce pain and swelling. You may bathe or shower after the staples are removed at the first office visit following surgery.  MEDICATIONS: You may resume your regular medications. Please take the pain medication as prescribed on the medication. Do not take pain medication on an empty stomach. You have been given a prescription for a blood thinner (Lovenox or Coumadin). Please take the medication as instructed. (NOTE: After completing a 2 week course of Lovenox, take one Enteric-coated aspirin once a day. This along with elevation will help reduce the possibility of phlebitis in your operated leg.) Do not drive or drink alcoholic beverages when taking pain medications.  CALL THE OFFICE FOR: Temperature above 101 degrees Excessive bleeding or drainage on the dressing. Excessive swelling, coldness, or paleness of the toes. Persistent nausea and vomiting.  FOLLOW-UP:  You  should have an appointment to return to the office in 10-14 days after surgery. Arrangements have been made for continuation of Physical Therapy (either home therapy or outpatient therapy).   [facility] Department Directory         [email or web address]       [email or web address]          Cardiology  Appointments: [place] - [phone] [place] - [phone]  Endocrinology  Appointments: [place] - [phone] [place] - [phone]  Gastroenterology  Appointments: [place] - [phone] [place] - [phone]        General Surgery   Appointments: [place] - [phone]  Internal Medicine/Family Medicine  Appointments: [place] - [phone] [place] - [phone] [place] - [phone]  Metabolic and Weigh Loss Surgery  Appointments: [place] - [phone]        Neurology  Appointments: [place] - [phone] [place] - [phone]  Neurosurgery  Appointments: [place] - [phone]  Obstetrics & Gynecology  Appointments: [place] - [phone] [place] - [phone]        Pediatrics  Appointments: [place] - [phone] [place] - [phone]  Physiatry  Appointments: [place] -[phone]  Physical Therapy  Appointments: [place] - [phone] [place] - [phone]        Podiatry  Appointments: [place] - [phone] [place] - [phone]  Pulmonology  Appointments: [place] - [phone]  Rheumatology  Appointments: [place] - [phone]        [place] Location: [facility]  [address] [place], [place]  [address]  [place] Location: [facility] [address] [place], [place]  [address]  [place] Location: [facility] [address] [place], [place]  [address]    

## 2022-05-30 ENCOUNTER — Other Ambulatory Visit: Payer: Self-pay

## 2022-05-30 ENCOUNTER — Encounter
Admission: RE | Admit: 2022-05-30 | Discharge: 2022-05-30 | Disposition: A | Discharge status: Home / Self Care | Payer: Medicare Other | Source: Ambulatory Visit | Attending: Orthopedic Surgery | Admitting: Orthopedic Surgery

## 2022-05-30 VITALS — BP 130/58 | HR 64 | Resp 12 | Ht 62.0 in | Wt 122.0 lb

## 2022-05-30 DIAGNOSIS — Z01818 Encounter for other preprocedural examination: Secondary | ICD-10-CM | POA: Diagnosis not present

## 2022-05-30 DIAGNOSIS — E559 Vitamin D deficiency, unspecified: Secondary | ICD-10-CM | POA: Diagnosis not present

## 2022-05-30 DIAGNOSIS — Z01810 Encounter for preprocedural cardiovascular examination: Secondary | ICD-10-CM

## 2022-05-30 DIAGNOSIS — M1712 Unilateral primary osteoarthritis, left knee: Secondary | ICD-10-CM | POA: Diagnosis not present

## 2022-05-30 DIAGNOSIS — Z880 Allergy status to penicillin: Secondary | ICD-10-CM | POA: Diagnosis not present

## 2022-05-30 DIAGNOSIS — I671 Cerebral aneurysm, nonruptured: Secondary | ICD-10-CM | POA: Diagnosis not present

## 2022-05-30 LAB — SEDIMENTATION RATE: Sed Rate: 13 mm/hr (ref 0–22)

## 2022-05-30 LAB — TYPE AND SCREEN
ABO/RH(D): O POS
Antibody Screen: NEGATIVE

## 2022-05-30 LAB — URINALYSIS, ROUTINE W REFLEX MICROSCOPIC
Bilirubin Urine: NEGATIVE
Glucose, UA: NEGATIVE mg/dL
Hgb urine dipstick: NEGATIVE
Ketones, ur: NEGATIVE mg/dL
Nitrite: NEGATIVE
Protein, ur: NEGATIVE mg/dL
Specific Gravity, Urine: 1.019 (ref 1.005–1.030)
pH: 5 (ref 5.0–8.0)

## 2022-05-30 LAB — SURGICAL PCR SCREEN
MRSA, PCR: NEGATIVE
Staphylococcus aureus: NEGATIVE

## 2022-05-30 LAB — C-REACTIVE PROTEIN: CRP: 0.5 mg/dL (ref <1.0)

## 2022-05-30 NOTE — Patient Instructions (Signed)
Your procedure is scheduled on: Friday 06/10/22 Report to [place]. To find out your arrival time please call [phone] between 1PM - 3PM on Thursday 06/09/22.  Remember: Instructions that are not followed completely may result in serious medical risk, up to and including death, or upon the discretion of your surgeon and anesthesiologist your surgery may need to be rescheduled.     _X__ 1. Do not eat food after midnight the night before your procedure.                 No gum chewing or hard candies. You may drink clear liquids up to 2 hours                 before you are scheduled to arrive for your surgery- DO not drink clear                 liquids within 2 hours of the start of your surgery.                 Clear Liquids include:  water, apple juice without pulp, clear carbohydrate                 drink such as Clearfast or Gatorade, Black Coffee or Tea (Do not add                 anything to coffee or tea). Diabetics water only  Finish the Ensure "clear" drink 2 hours before arriving for surgery  __X__2.  On the morning of surgery brush your teeth with toothpaste and water, you                 may rinse your mouth with mouthwash if you wish.  Do not swallow any              toothpaste of mouthwash.     _X__ 3.  No Alcohol for 24 hours before or after surgery.   _X__ 4.  Do Not Smoke or use e-cigarettes For 24 Hours Prior to Your Surgery.                 Do not use any chewable tobacco products for at least 6 hours prior to                 surgery.  ____  5.  Bring all medications with you on the day of surgery if instructed.   __X__  6.  Notify your doctor if there is any change in your medical condition      (cold, fever, infections).     Do not wear jewelry, make-up, hairpins, clips or nail or toenail polish. Do not wear lotions, powders, or perfumes.  Do not shave body hair 48 hours prior to surgery. Men may shave face  and neck. Do not bring valuables to the hospital.    [facility] is not responsible for any belongings or valuables.  Contacts, dentures/partials or body piercings may not be worn into surgery. Bring a case for your contacts, glasses or hearing aids, a denture cup will be supplied. Leave your suitcase in the car. After surgery it may be brought to your room. For patients admitted to the hospital, discharge time is determined by your treatment team.   Patients discharged the day of surgery will need an adult driver. You will not be allowed to drive yourself home, take an uber, lyft or taxi. You may  use medical transportation such as [place] as long as you have an adult with you or waiting to assist you upon your arrival home. You must have an adult over 72 years old in the home with you for the first 24 hours after surgery   Please read over the following fact sheets that you were given:   MRSA Information, CHG soap, Ensure, Incentive Spirometer  __X__ Take these medicines the morning of surgery with A SIP OF WATER:    1. propranolol ER (INDERAL LA) 60 MG 24 hr capsule   2.   3.   4.  5.  6.  ____ Fleet Enema (as directed)   __X__ Use CHG Soap/SAGE wipes as directed  ____ Use inhalers on the day of surgery  ____ Stop metformin/Janumet/Farxiga 2 days prior to surgery    ____ Take 1/2 of usual insulin dose the night before surgery. No insulin the morning          of surgery.   ____ Stop Blood Thinners Coumadin/Plavix/Xarelto/Pleta/Pradaxa/Eliquis/Effient/Aspirin  on   Or contact your Surgeon, Cardiologist or Medical Doctor regarding  ability to stop your blood thinners  __X__ Stop Anti-inflammatories 7 days before surgery such as Advil, Ibuprofen, Motrin,  BC or Goodies Powder, Naprosyn, Naproxen, Aleve, Aspirin YOU MAY SUBSTITUTE TYLENOL IF NEEDED   __X__ Stop all herbals and supplements, fish oil or vitamins FOR 7 DAYS  until after surgery.    ____ Bring C-Pap to  the hospital.        Preparing for Surgery with CHLORHEXIDINE GLUCONATE (CHG) Soap  Chlorhexidine Gluconate (CHG) Soap  o An antiseptic cleaner that kills germs and bonds with the skin to continue killing germs even after washing  o Used for showering the night before surgery and morning of surgery  Before surgery, you can play an important role by reducing the number of germs on your skin.  CHG (Chlorhexidine gluconate) soap is an antiseptic cleanser which kills germs and bonds with the skin to continue killing germs even after washing.  Please do not use if you have an allergy to CHG or antibacterial soaps. If your skin becomes reddened/irritated stop using the CHG.  1. Shower the NIGHT BEFORE SURGERY and the MORNING OF SURGERY with CHG soap.  2. If you choose to wash your hair, wash your hair first as usual with your normal shampoo.  3. After shampooing, rinse your hair and body thoroughly to remove the shampoo.  4. Use CHG as you would any other liquid soap. You can apply CHG directly to the skin and wash gently with a scrungie or a clean washcloth.  5. Apply the CHG soap to your body only from the neck down. Do not use on open wounds or open sores. Avoid contact with your eyes, ears, mouth, and genitals (private parts). Wash face and genitals (private parts) with your normal soap.  6. Wash thoroughly, paying special attention to the area where your surgery will be performed.  7. Thoroughly rinse your body with warm water.  8. Do not shower/wash with your normal soap after using and rinsing off the CHG soap.  9. Pat yourself dry with a clean towel.  10. Wear clean pajamas to bed the night before surgery.  12. Place clean sheets on your bed the night of your first shower and do not sleep with pets.  13. Shower again with the CHG soap on the day of surgery prior to arriving at the hospital.  14. Do not  apply any deodorants/lotions/powders.  15. Please wear clean  clothes to the hospital.

## 2022-06-01 ENCOUNTER — Encounter: Payer: Self-pay | Admitting: Orthopedic Surgery

## 2022-06-01 LAB — IGE: IgE (Immunoglobulin E), Serum: 31 IU/mL (ref 6–495)

## 2022-06-09 ENCOUNTER — Encounter: Payer: Self-pay | Admitting: Orthopedic Surgery

## 2022-06-09 NOTE — H&P (Signed)
ORTHOPAEDIC HISTORY & PHYSICAL Regino Bellow, PA - 05/31/2022 1:15 PM EST Formatting of this note is different from the original. Images from the original note were not included. Chief Complaint Chief Complaint Patient presents with Knee Pain H & P LEFT KNEE  Reason for Visit Katrina Brown is a 77 y.o. who presents today for a history and physical. She is to undergo a left total knee arthroplasty on 06/10/2022. Since her last visit in the clinic there is been no change in her condition. Patient versus her desire to continue to proceed with surgery.  She has a long history of left knee pain. She underwent left knee arthroscopy, partial medial meniscectomy, and chondroplasty in February 2018. She localizes most of the pain along the medial aspect of the knee. She reports some swelling, no locking, and some giving way of the knee. The pain is aggravated by any weight bearing. The knee pain limits the patient's ability to ambulate long distances. The patient has not appreciated any significant improvement despite turmeric, intra-articular corticosteroid injections, viscosupplementation, and activity modification. She is not using any ambulatory aids. The patient states that the knee pain has progressed to the point that it is significantly interfering with her activities of daily living.  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 Marry Guan COLONOSCOPY 06/24/2009 Exostectomy of the first metatarsocuneiform, second metatarsocuneiform, and entered cuneiform exostoses 03/31/2010 Dr. Samara Deist EPIGASTRIC  HERNIA REPAIR 2014 ANTERIOR AND POSTERIOR VAGINAL REPAIR 05/12/2014 Left knee arthroscopy, partial medial meniscetomy, and medial chondroplasty 06/27/2016 Dr Marry Guan  Past Family History Family History Problem Relation Age of Onset Stroke Mother No Known Problems Father No Known Problems Brother Anesthesia problems Neg Hx  Medications Current Outpatient Medications Ordered in Epic Medication Sig Dispense Refill aspirin 81 MG EC tablet Take 1 tablet (81 mg total) by mouth once daily for 7 days 7 tablet 0 calcium carbonate-vitamin D3 600 mg-20 mcg (800 unit) Tab Take 1 tablet by mouth once daily cholecalciferol, vitamin D3, (VITAMIN D3) 125 mcg (5,000 unit) tablet Take 1 capsule by mouth once daily COLLAGEN MISC Use cyanocobalamin (VITAMIN B12) 1000 MCG tablet Take 1,000 mcg by mouth once daily. omega 3-dha-epa-fish oil 1,000 mg (120 mg-180 mg) Cap Take 1 tablet by mouth once daily. propranoloL (INDERAL LA) 60 MG LA capsule TAKE 1 CAPSULE BY MOUTH ONCE DAILY 100 capsule 2 turmeric root extract 500 mg Cap Take 500 mg by mouth once daily vitamin E 1000 UNIT capsule Take 1,000 Units by mouth once daily  No current Epic-ordered facility-administered medications on file.  Allergies Allergies Allergen Reactions Penicillins Other (See Comments) Does not remember, when she was a child   Review of Systems A comprehensive 14 point ROS was performed, reviewed, and the pertinent orthopaedic findings are documented in the HPI.  Exam BP 130/80 (BP Location: Left upper arm, Patient Position: Sitting, BP Cuff Size: Adult)  Ht 157.5 cm (5\' 2" )  Wt 55.7 kg (122 lb 12.8 oz)  BMI 22.46 kg/m  General: Well-developed well-nourished female seen in no acute distress.  HEENT: Atraumatic,normocephalic. Pupils are equal and reactive to light. Oropharynx is clear with moist mucosa  Lungs: Clear to auscultation bilaterally  Cardiovascular: Regular rate and rhythm. Normal S1, S2. No murmurs. No  appreciable gallops or rubs. Peripheral pulses are palpable.  Abdomen: Soft, non-tender, nondistended. Bowel sounds present  Extremity: Left Knee: Soft tissue swelling: mild Effusion: none Erythema: none Crepitance: mild Tenderness: medial Alignment: relative varus Mediolateral laxity: medial pseudolaxity Posterior sag: negative Patellar tracking: Good tracking without evidence of subluxation or tilt Atrophy: No significant atrophy. Quadriceps tone was good. Range of motion: 0/3/128 degrees   Neurological:  The patient is alert and oriented Sensation to light touch appears to be intact and within normal limits Gross motor strength appeared to be equal to 5/5  Vascular :  Peripheral pulses felt to be palpable. Capillary refill appears to be intact and within normal limits  X-ray  1. AP standing, lateral and sunrise view of the left knee ordered and interpreted on today's visit shows bone-on-bone to the medial compartment space with some varus alignment being noted. She is noted to have subchondral sclerosis as well as osteophyte formation. Patella is tracking well. Is noted to have arthritis in the tricompartmental fashion.  Impression  1. Degenerative arthrosis left knee  Plan  1. Patient has already discontinued all over-the-counter medications including anticoagulation. 2. Past medical history was reviewed 3. Did discuss postop rehab course 4. Return to clinic 2 weeks postop. Sooner if any problems  This note was generated in part with voice recognition software and I apologize for any typographical errors that were not detected and corrected   Watt Climes PA Electronically signed by Regino Bellow, PA at 05/31/2022 1:42 PM EST

## 2022-06-10 ENCOUNTER — Other Ambulatory Visit: Payer: Self-pay

## 2022-06-10 ENCOUNTER — Encounter: Payer: Self-pay | Admitting: Orthopedic Surgery

## 2022-06-10 ENCOUNTER — Ambulatory Visit: Payer: Medicare Other | Admitting: Certified Registered"

## 2022-06-10 ENCOUNTER — Encounter
Admission: RE | Disposition: A | Discharge status: Home Health | Payer: Self-pay | Source: Home / Self Care | Attending: Orthopedic Surgery

## 2022-06-10 ENCOUNTER — Observation Stay
Admission: RE | Admit: 2022-06-10 | Discharge: 2022-06-11 | Disposition: A | Discharge status: Home Health | Payer: Medicare Other | Attending: Orthopedic Surgery | Admitting: Orthopedic Surgery

## 2022-06-10 ENCOUNTER — Observation Stay: Payer: Medicare Other

## 2022-06-10 ENCOUNTER — Ambulatory Visit: Payer: Medicare Other | Admitting: Urgent Care

## 2022-06-10 DIAGNOSIS — E559 Vitamin D deficiency, unspecified: Secondary | ICD-10-CM

## 2022-06-10 DIAGNOSIS — I10 Essential (primary) hypertension: Secondary | ICD-10-CM | POA: Insufficient documentation

## 2022-06-10 DIAGNOSIS — M1712 Unilateral primary osteoarthritis, left knee: Secondary | ICD-10-CM | POA: Diagnosis present

## 2022-06-10 DIAGNOSIS — Z7982 Long term (current) use of aspirin: Secondary | ICD-10-CM | POA: Diagnosis not present

## 2022-06-10 DIAGNOSIS — Z96659 Presence of unspecified artificial knee joint: Secondary | ICD-10-CM

## 2022-06-10 DIAGNOSIS — Z79899 Other long term (current) drug therapy: Secondary | ICD-10-CM | POA: Diagnosis not present

## 2022-06-10 DIAGNOSIS — Z880 Allergy status to penicillin: Secondary | ICD-10-CM

## 2022-06-10 DIAGNOSIS — I671 Cerebral aneurysm, nonruptured: Secondary | ICD-10-CM

## 2022-06-10 HISTORY — PX: KNEE ARTHROPLASTY: SHX992

## 2022-06-10 SURGERY — ARTHROPLASTY, KNEE, TOTAL, USING IMAGELESS COMPUTER-ASSISTED NAVIGATION
Anesthesia: Spinal | Site: Knee | Laterality: Left

## 2022-06-10 MED ORDER — ONDANSETRON HCL 4 MG PO TABS: 4.0000 mg | ORAL_TABLET | Freq: Four times a day (QID) | ORAL | Status: DC | PRN

## 2022-06-10 MED ORDER — BUPIVACAINE LIPOSOME 1.3 % IJ SUSP
INTRAMUSCULAR | Status: AC
  Filled 2022-06-10: qty 20

## 2022-06-10 MED ORDER — IRRISEPT - 450ML BOTTLE WITH 0.05% CHG IN STERILE WATER, USP 99.95% OPTIME
TOPICAL | Status: DC | PRN
  Administered 2022-06-10: 450 mL

## 2022-06-10 MED ORDER — ACETAMINOPHEN 325 MG PO TABS: 325.0000 mg | ORAL_TABLET | Freq: Four times a day (QID) | ORAL | Status: DC | PRN

## 2022-06-10 MED ORDER — CELECOXIB 200 MG PO CAPS
200.0000 mg | ORAL_CAPSULE | Freq: Two times a day (BID) | ORAL | Status: DC
  Administered 2022-06-10 – 2022-06-11 (×2): 200 mg via ORAL
  Filled 2022-06-10 (×2): qty 1

## 2022-06-10 MED ORDER — FLEET ENEMA 7-19 GM/118ML RE ENEM: 1.0000 | ENEMA | Freq: Once | RECTAL | Status: DC | PRN

## 2022-06-10 MED ORDER — BUPIVACAINE HCL (PF) 0.5 % IJ SOLN
INTRAMUSCULAR | Status: DC | PRN
  Administered 2022-06-10: 2.5 mL

## 2022-06-10 MED ORDER — ALUM & MAG HYDROXIDE-SIMETH 200-200-20 MG/5ML PO SUSP: 30.0000 mL | ORAL | Status: DC | PRN

## 2022-06-10 MED ORDER — OXYCODONE HCL 5 MG PO TABS
5.0000 mg | ORAL_TABLET | ORAL | Status: DC | PRN
  Administered 2022-06-10 – 2022-06-11 (×2): 5 mg via ORAL
  Filled 2022-06-10 (×2): qty 1

## 2022-06-10 MED ORDER — MIDAZOLAM HCL 2 MG/2ML IJ SOLN
INTRAMUSCULAR | Status: AC
  Filled 2022-06-10: qty 2

## 2022-06-10 MED ORDER — CHLORHEXIDINE GLUCONATE 0.12 % MT SOLN
OROMUCOSAL | Status: AC
  Administered 2022-06-10: 15 mL via OROMUCOSAL
  Filled 2022-06-10: qty 15

## 2022-06-10 MED ORDER — METOCLOPRAMIDE HCL 5 MG PO TABS
10.0000 mg | ORAL_TABLET | Freq: Three times a day (TID) | ORAL | Status: DC
  Administered 2022-06-10 – 2022-06-11 (×3): 10 mg via ORAL
  Filled 2022-06-10 (×3): qty 2

## 2022-06-10 MED ORDER — GABAPENTIN 300 MG PO CAPS
ORAL_CAPSULE | ORAL | Status: AC
  Administered 2022-06-10: 300 mg via ORAL
  Filled 2022-06-10: qty 1

## 2022-06-10 MED ORDER — TRAMADOL HCL 50 MG PO TABS
50.0000 mg | ORAL_TABLET | ORAL | Status: DC | PRN
  Administered 2022-06-10: 50 mg via ORAL
  Filled 2022-06-10: qty 1

## 2022-06-10 MED ORDER — EPHEDRINE 5 MG/ML INJ
INTRAVENOUS | Status: AC
  Filled 2022-06-10: qty 5

## 2022-06-10 MED ORDER — ORAL CARE MOUTH RINSE: 15.0000 mL | Freq: Once | OROMUCOSAL | Status: AC

## 2022-06-10 MED ORDER — BUPIVACAINE HCL (PF) 0.25 % IJ SOLN
INTRAMUSCULAR | Status: AC
  Filled 2022-06-10: qty 60

## 2022-06-10 MED ORDER — PROPRANOLOL HCL ER 60 MG PO CP24
60.0000 mg | ORAL_CAPSULE | Freq: Every day | ORAL | Status: DC
  Filled 2022-06-10: qty 1

## 2022-06-10 MED ORDER — PHENOL 1.4 % MT LIQD: 1.0000 | OROMUCOSAL | Status: DC | PRN

## 2022-06-10 MED ORDER — SODIUM CHLORIDE (PF) 0.9 % IJ SOLN
INTRAMUSCULAR | Status: DC | PRN
  Administered 2022-06-10: 120 mL via INTRAMUSCULAR

## 2022-06-10 MED ORDER — CEFAZOLIN SODIUM-DEXTROSE 2-4 GM/100ML-% IV SOLN
2.0000 g | Freq: Four times a day (QID) | INTRAVENOUS | Status: AC
  Administered 2022-06-10 (×2): 2 g via INTRAVENOUS
  Filled 2022-06-10 (×2): qty 100

## 2022-06-10 MED ORDER — CELECOXIB 200 MG PO CAPS: 400.0000 mg | ORAL_CAPSULE | Freq: Once | ORAL | Status: AC

## 2022-06-10 MED ORDER — FERROUS SULFATE 325 (65 FE) MG PO TABS
325.0000 mg | ORAL_TABLET | Freq: Two times a day (BID) | ORAL | Status: DC
  Administered 2022-06-10 – 2022-06-11 (×2): 325 mg via ORAL
  Filled 2022-06-10 (×2): qty 1

## 2022-06-10 MED ORDER — CEFAZOLIN SODIUM-DEXTROSE 2-4 GM/100ML-% IV SOLN
INTRAVENOUS | Status: AC
  Filled 2022-06-10: qty 100

## 2022-06-10 MED ORDER — CELECOXIB 200 MG PO CAPS
ORAL_CAPSULE | ORAL | Status: AC
  Administered 2022-06-10: 400 mg via ORAL
  Filled 2022-06-10: qty 1

## 2022-06-10 MED ORDER — MIDAZOLAM HCL 5 MG/5ML IJ SOLN
INTRAMUSCULAR | Status: DC | PRN
  Administered 2022-06-10 (×2): 1 mg via INTRAVENOUS

## 2022-06-10 MED ORDER — PROPOFOL 10 MG/ML IV BOLUS
INTRAVENOUS | Status: AC
  Filled 2022-06-10: qty 20

## 2022-06-10 MED ORDER — DEXAMETHASONE SODIUM PHOSPHATE 10 MG/ML IJ SOLN
INTRAMUSCULAR | Status: AC
  Administered 2022-06-10: 8 mg via INTRAVENOUS
  Filled 2022-06-10: qty 1

## 2022-06-10 MED ORDER — CEFAZOLIN SODIUM-DEXTROSE 2-4 GM/100ML-% IV SOLN
2.0000 g | INTRAVENOUS | Status: AC
  Administered 2022-06-10: 2 g via INTRAVENOUS

## 2022-06-10 MED ORDER — ONDANSETRON HCL 4 MG/2ML IJ SOLN: 4.0000 mg | Freq: Four times a day (QID) | INTRAMUSCULAR | Status: DC | PRN

## 2022-06-10 MED ORDER — ENSURE PRE-SURGERY PO LIQD
296.0000 mL | Freq: Once | ORAL | Status: AC
  Administered 2022-06-10: 296 mL via ORAL
  Filled 2022-06-10: qty 296

## 2022-06-10 MED ORDER — EPHEDRINE SULFATE (PRESSORS) 50 MG/ML IJ SOLN
INTRAMUSCULAR | Status: DC | PRN
  Administered 2022-06-10 (×2): 5 mg via INTRAVENOUS

## 2022-06-10 MED ORDER — PANTOPRAZOLE SODIUM 40 MG PO TBEC
40.0000 mg | DELAYED_RELEASE_TABLET | Freq: Two times a day (BID) | ORAL | Status: DC
  Administered 2022-06-10 – 2022-06-11 (×2): 40 mg via ORAL
  Filled 2022-06-10 (×2): qty 1

## 2022-06-10 MED ORDER — FENTANYL CITRATE (PF) 100 MCG/2ML IJ SOLN
INTRAMUSCULAR | Status: DC | PRN
  Administered 2022-06-10 (×2): 50 ug via INTRAVENOUS

## 2022-06-10 MED ORDER — CHLORHEXIDINE GLUCONATE 0.12 % MT SOLN: 15.0000 mL | Freq: Once | OROMUCOSAL | Status: AC

## 2022-06-10 MED ORDER — DIPHENHYDRAMINE HCL 12.5 MG/5ML PO ELIX: 12.5000 mg | ORAL_SOLUTION | ORAL | Status: DC | PRN

## 2022-06-10 MED ORDER — DEXAMETHASONE SODIUM PHOSPHATE 10 MG/ML IJ SOLN: 8.0000 mg | Freq: Once | INTRAMUSCULAR | Status: AC

## 2022-06-10 MED ORDER — LACTATED RINGERS IV SOLN: INTRAVENOUS | Status: DC

## 2022-06-10 MED ORDER — FAMOTIDINE 20 MG PO TABS
ORAL_TABLET | ORAL | Status: AC
  Administered 2022-06-10: 20 mg via ORAL
  Filled 2022-06-10: qty 1

## 2022-06-10 MED ORDER — MENTHOL 3 MG MT LOZG: 1.0000 | LOZENGE | OROMUCOSAL | Status: DC | PRN

## 2022-06-10 MED ORDER — BISACODYL 10 MG RE SUPP: 10.0000 mg | Freq: Every day | RECTAL | Status: DC | PRN

## 2022-06-10 MED ORDER — GABAPENTIN 300 MG PO CAPS: 300.0000 mg | ORAL_CAPSULE | Freq: Once | ORAL | Status: AC

## 2022-06-10 MED ORDER — HYDROMORPHONE HCL 1 MG/ML IJ SOLN: 0.5000 mg | INTRAMUSCULAR | Status: DC | PRN

## 2022-06-10 MED ORDER — ACETAMINOPHEN 10 MG/ML IV SOLN
1000.0000 mg | Freq: Four times a day (QID) | INTRAVENOUS | Status: DC
  Administered 2022-06-10 – 2022-06-11 (×3): 1000 mg via INTRAVENOUS
  Filled 2022-06-10 (×4): qty 100

## 2022-06-10 MED ORDER — PROPOFOL 500 MG/50ML IV EMUL
INTRAVENOUS | Status: DC | PRN
  Administered 2022-06-10: 50 ug/kg/min via INTRAVENOUS

## 2022-06-10 MED ORDER — STERILE WATER FOR IRRIGATION IR SOLN
Status: DC | PRN
  Administered 2022-06-10: 1000 mL

## 2022-06-10 MED ORDER — PHENYLEPHRINE 80 MCG/ML (10ML) SYRINGE FOR IV PUSH (FOR BLOOD PRESSURE SUPPORT)
PREFILLED_SYRINGE | INTRAVENOUS | Status: DC | PRN
  Administered 2022-06-10: 40 ug via INTRAVENOUS
  Administered 2022-06-10: 80 ug via INTRAVENOUS

## 2022-06-10 MED ORDER — TRANEXAMIC ACID-NACL 1000-0.7 MG/100ML-% IV SOLN
INTRAVENOUS | Status: AC
  Filled 2022-06-10: qty 100

## 2022-06-10 MED ORDER — FENTANYL CITRATE (PF) 100 MCG/2ML IJ SOLN
INTRAMUSCULAR | Status: AC
  Filled 2022-06-10: qty 2

## 2022-06-10 MED ORDER — SENNOSIDES-DOCUSATE SODIUM 8.6-50 MG PO TABS
1.0000 | ORAL_TABLET | Freq: Two times a day (BID) | ORAL | Status: DC
  Administered 2022-06-10 – 2022-06-11 (×2): 1 via ORAL
  Filled 2022-06-10 (×2): qty 1

## 2022-06-10 MED ORDER — MAGNESIUM HYDROXIDE 400 MG/5ML PO SUSP
30.0000 mL | Freq: Every day | ORAL | Status: DC
  Administered 2022-06-10 – 2022-06-11 (×2): 30 mL via ORAL
  Filled 2022-06-10 (×2): qty 30

## 2022-06-10 MED ORDER — FAMOTIDINE 20 MG PO TABS: 20.0000 mg | ORAL_TABLET | Freq: Once | ORAL | Status: AC

## 2022-06-10 MED ORDER — SODIUM CHLORIDE 0.9 % IV SOLN: INTRAVENOUS | Status: DC

## 2022-06-10 MED ORDER — ENOXAPARIN SODIUM 30 MG/0.3ML IJ SOSY
30.0000 mg | PREFILLED_SYRINGE | Freq: Two times a day (BID) | INTRAMUSCULAR | Status: DC
  Administered 2022-06-11: 30 mg via SUBCUTANEOUS
  Filled 2022-06-10 (×2): qty 0.3

## 2022-06-10 MED ORDER — ONDANSETRON HCL 4 MG/2ML IJ SOLN: 4.0000 mg | Freq: Once | INTRAMUSCULAR | Status: DC | PRN

## 2022-06-10 MED ORDER — TRANEXAMIC ACID-NACL 1000-0.7 MG/100ML-% IV SOLN
INTRAVENOUS | Status: AC
  Administered 2022-06-10: 1000 mg via INTRAVENOUS
  Filled 2022-06-10: qty 100

## 2022-06-10 MED ORDER — OXYCODONE HCL 5 MG PO TABS
10.0000 mg | ORAL_TABLET | ORAL | Status: DC | PRN
  Administered 2022-06-11: 10 mg via ORAL
  Filled 2022-06-10: qty 2

## 2022-06-10 MED ORDER — TRANEXAMIC ACID-NACL 1000-0.7 MG/100ML-% IV SOLN: 1000.0000 mg | Freq: Once | INTRAVENOUS | Status: AC

## 2022-06-10 MED ORDER — TRANEXAMIC ACID-NACL 1000-0.7 MG/100ML-% IV SOLN
1000.0000 mg | INTRAVENOUS | Status: AC
  Administered 2022-06-10: 1000 mg via INTRAVENOUS

## 2022-06-10 MED ORDER — SODIUM CHLORIDE 0.9 % IR SOLN
Status: DC | PRN
  Administered 2022-06-10: 3000 mL

## 2022-06-10 MED ORDER — FENTANYL CITRATE (PF) 100 MCG/2ML IJ SOLN: 25.0000 ug | INTRAMUSCULAR | Status: DC | PRN

## 2022-06-10 MED ORDER — CHLORHEXIDINE GLUCONATE 4 % EX LIQD: 60.0000 mL | Freq: Once | CUTANEOUS | Status: DC

## 2022-06-10 SURGICAL SUPPLY — 68 items
ATTUNE PS FEM LT SZ 4 CEM KNEE (Femur) IMPLANT
ATTUNE PSRP INSR SZ4 6 KNEE (Insert) IMPLANT
BASE TIBIAL ROT PLAT SZ 3 KNEE (Knees) IMPLANT
BATTERY INSTRU NAVIGATION (MISCELLANEOUS) ×4 IMPLANT
BLADE SAW 70X12.5 (BLADE) ×1 IMPLANT
BLADE SAW 90X13X1.19 OSCILLAT (BLADE) ×1 IMPLANT
BLADE SAW 90X25X1.19 OSCILLAT (BLADE) ×1 IMPLANT
CEMENT HV SMART SET (Cement) IMPLANT
COOLER POLAR GLACIER W/PUMP (MISCELLANEOUS) ×1 IMPLANT
CUFF TOURN SGL QUICK 24 (TOURNIQUET CUFF)
CUFF TOURN SGL QUICK 34 (TOURNIQUET CUFF)
CUFF TRNQT CYL 24X4X16.5-23 (TOURNIQUET CUFF) IMPLANT
CUFF TRNQT CYL 34X4.125X (TOURNIQUET CUFF) IMPLANT
DRAPE 3/4 80X56 (DRAPES) ×1 IMPLANT
DRAPE INCISE IOBAN 66X45 STRL (DRAPES) IMPLANT
DRSG MEPILEX SACRM 8.7X9.8 (GAUZE/BANDAGES/DRESSINGS) ×1 IMPLANT
DRSG NON-ADHERENT DERMACEA 3X4 (GAUZE/BANDAGES/DRESSINGS) ×1 IMPLANT
DRSG OPSITE POSTOP 4X14 (GAUZE/BANDAGES/DRESSINGS) ×1 IMPLANT
DRSG TEGADERM 4X4.75 (GAUZE/BANDAGES/DRESSINGS) ×1 IMPLANT
DURAPREP 26ML APPLICATOR (WOUND CARE) ×2 IMPLANT
ELECT CAUTERY BLADE 6.4 (BLADE) ×1 IMPLANT
ELECT REM PT RETURN 9FT ADLT (ELECTROSURGICAL) ×1
ELECTRODE REM PT RTRN 9FT ADLT (ELECTROSURGICAL) ×1 IMPLANT
EX-PIN ORTHOLOCK NAV 4X150 (PIN) ×2 IMPLANT
GLOVE BIOGEL M STRL SZ7.5 (GLOVE) ×2 IMPLANT
GLOVE SRG 8 PF TXTR STRL LF DI (GLOVE) ×1 IMPLANT
GLOVE SURG UNDER POLY LF SZ8 (GLOVE) ×1
GOWN STRL REUS W/ TWL LRG LVL3 (GOWN DISPOSABLE) ×1 IMPLANT
GOWN STRL REUS W/TWL LRG LVL3 (GOWN DISPOSABLE) ×1
GOWN TOGA ZIPPER T7+ PEEL AWAY (MISCELLANEOUS) ×1 IMPLANT
HANDLE YANKAUER SUCT OPEN TIP (MISCELLANEOUS) IMPLANT
HEMOVAC 400CC 10FR (MISCELLANEOUS) ×1 IMPLANT
HOLDER FOLEY CATH W/STRAP (MISCELLANEOUS) ×1 IMPLANT
IV NS IRRIG 3000ML ARTHROMATIC (IV SOLUTION) ×1 IMPLANT
KIT TURNOVER KIT A (KITS) ×1 IMPLANT
KNIFE SCULPS 14X20 (INSTRUMENTS) ×1 IMPLANT
MANIFOLD NEPTUNE II (INSTRUMENTS) ×2 IMPLANT
NDL SPNL 20GX3.5 QUINCKE YW (NEEDLE) ×2 IMPLANT
NEEDLE SPNL 20GX3.5 QUINCKE YW (NEEDLE) ×2 IMPLANT
NS IRRIG 500ML POUR BTL (IV SOLUTION) ×1 IMPLANT
PACK TOTAL KNEE (MISCELLANEOUS) ×1 IMPLANT
PAD ABD DERMACEA PRESS 5X9 (GAUZE/BANDAGES/DRESSINGS) ×2 IMPLANT
PAD WRAPON POLAR KNEE (MISCELLANEOUS) ×1 IMPLANT
PATELLA MEDIAL ATTUN 35MM KNEE (Knees) IMPLANT
PIN DRILL FIX HALF THREAD (BIT) ×2 IMPLANT
PIN FIXATION 1/8DIA X 3INL (PIN) ×1 IMPLANT
PULSAVAC PLUS IRRIG FAN TIP (DISPOSABLE) ×1
SOL PREP PVP 2OZ (MISCELLANEOUS) ×1
SOLUTION IRRIG SURGIPHOR (IV SOLUTION) ×1 IMPLANT
SOLUTION PREP PVP 2OZ (MISCELLANEOUS) ×1 IMPLANT
SPONGE DRAIN TRACH 4X4 STRL 2S (GAUZE/BANDAGES/DRESSINGS) ×1 IMPLANT
STAPLER SKIN PROX 35W (STAPLE) ×1 IMPLANT
STOCKINETTE IMPERV 14X48 (MISCELLANEOUS) ×1 IMPLANT
STRAP TIBIA SHORT (MISCELLANEOUS) ×1 IMPLANT
SUCTION FRAZIER HANDLE 10FR (MISCELLANEOUS) ×1
SUCTION TUBE FRAZIER 10FR DISP (MISCELLANEOUS) ×1 IMPLANT
SUT VIC AB 0 CT1 36 (SUTURE) ×1 IMPLANT
SUT VIC AB 1 CT1 36 (SUTURE) ×2 IMPLANT
SUT VIC AB 2-0 CT2 27 (SUTURE) ×1 IMPLANT
SYR 30ML LL (SYRINGE) ×2 IMPLANT
TIBIAL BASE ROT PLAT SZ 3 KNEE (Knees) ×1 IMPLANT
TIP FAN IRRIG PULSAVAC PLUS (DISPOSABLE) ×1 IMPLANT
TOWEL OR 17X26 4PK STRL BLUE (TOWEL DISPOSABLE) IMPLANT
TOWER CARTRIDGE SMART MIX (DISPOSABLE) ×1 IMPLANT
TRAP FLUID SMOKE EVACUATOR (MISCELLANEOUS) ×1 IMPLANT
TRAY FOLEY MTR SLVR 16FR STAT (SET/KITS/TRAYS/PACK) ×1 IMPLANT
WATER STERILE IRR 1000ML POUR (IV SOLUTION) ×1 IMPLANT
WRAPON POLAR PAD KNEE (MISCELLANEOUS) ×1

## 2022-06-10 NOTE — Plan of Care (Signed)
  Problem: Activity: Goal: Ability to avoid complications of mobility impairment will improve Outcome: Progressing Goal: Range of joint motion will improve Outcome: Progressing   Problem: Pain Management: Goal: Pain level will decrease with appropriate interventions Outcome: Progressing   Problem: Skin Integrity: Goal: Will show signs of wound healing Outcome: Progressing   Problem: Activity: Goal: Risk for activity intolerance will decrease Outcome: Progressing   Problem: Pain Managment: Goal: General experience of comfort will improve Outcome: Progressing   Problem: Safety: Goal: Ability to remain free from injury will improve Outcome: Progressing

## 2022-06-10 NOTE — Anesthesia Preprocedure Evaluation (Signed)
Anesthesia Evaluation  Patient identified by MRN, date of birth, ID band Patient awake    Reviewed: Allergy & Precautions, H&P , NPO status , Patient's Chart, lab work & pertinent test results, reviewed documented beta blocker date and time   Airway Mallampati: II   Neck ROM: full    Dental  (+) Poor Dentition   Pulmonary neg pulmonary ROS, former smoker   Pulmonary exam normal        Cardiovascular Exercise Tolerance: Good hypertension, On Medications negative cardio ROS Normal cardiovascular exam Rhythm:regular Rate:Normal     Neuro/Psych  PSYCHIATRIC DISORDERS  Depression    negative neurological ROS  negative psych ROS   GI/Hepatic negative GI ROS, Neg liver ROS,,,  Endo/Other  negative endocrine ROS    Renal/GU Renal diseasenegative Renal ROS  negative genitourinary   Musculoskeletal   Abdominal   Peds  Hematology negative hematology ROS (+)   Anesthesia Other Findings Past Medical History: No date: Arthritis No date: Cystocele No date: Depression No date: History of kidney stones No date: Hx of mitral valve insufficiency No date: Hypertension No date: Insomnia No date: Menopausal and perimenopausal disorder No date: Rectocele No date: Vaginal enterocele No date: Vaginal Pap smear, abnormal Past Surgical History: 1976: ABDOMINAL HYSTERECTOMY     Comment:  [place]/bso 05/12/2014: ANTERIOR AND POSTERIOR VAGINAL REPAIR No date: APPENDECTOMY No date: CEREBRAL ANEURYSM REPAIR 08/07/2017: CYSTOCELE REPAIR; N/A     Comment:  Procedure: ANTERIOR REPAIR (CYSTOCELE);  Surgeon:               Brayton Mars, MD;  Location: [facility] ORS;  Service:              Gynecology;  Laterality: N/A;  with enterocele ligation               and kelly plication [id number]: ENTEROCELE REPAIR 06/27/2016: KNEE ARTHROSCOPY; Left     Comment:  Procedure: ARTHROSCOPY LEFT  KNEE, PARTIAL MEDIAL               MENISECTOMY, MEDIAL  CHONDROPLASTY;  Surgeon: Dereck Leep, MD;  Location: [facility] ORS;  Service: Orthopedics;                Laterality: Left; 2010: KNEE SURGERY; Right BMI    Body Mass Index: 22.30 kg/m     Reproductive/Obstetrics negative OB ROS                             Anesthesia Physical Anesthesia Plan  ASA: 3  Anesthesia Plan: Spinal   Post-op Pain Management:    Induction:   PONV Risk Score and Plan: 4 or greater  Airway Management Planned:   Additional Equipment:   Intra-op Plan:   Post-operative Plan:   Informed Consent: I have reviewed the patients History and Physical, chart, labs and discussed the procedure including the risks, benefits and alternatives for the proposed anesthesia with the patient or authorized representative who has indicated his/her understanding and acceptance.     Dental Advisory Given  Plan Discussed with: CRNA  Anesthesia Plan Comments:        Anesthesia Quick Evaluation

## 2022-06-10 NOTE — Transfer of Care (Signed)
Immediate Anesthesia Transfer of Care Note  Patient: Katrina Brown  Procedure(s) Performed: COMPUTER ASSISTED TOTAL KNEE ARTHROPLASTY (Left: Knee)  Patient Location: PACU  Anesthesia Type:Spinal  Level of Consciousness: awake, alert , and oriented  Airway & Oxygen Therapy: Patient Spontanous Breathing  Post-op Assessment: Report given to RN and Post -op Vital signs reviewed and stable  Post vital signs: Reviewed and stable  Last Vitals:  Vitals Value Taken Time  BP    Temp    Pulse    Resp    SpO2      Last Pain:  Vitals:   06/10/22 1034  TempSrc: Temporal  PainSc: 0-No pain         Complications: No notable events documented.

## 2022-06-10 NOTE — Interval H&P Note (Signed)
History and Physical Interval Note:  06/10/2022 10:37 AM  Katrina Brown  has presented today for surgery, with the diagnosis of PRIMARY OSTEOARTHRITIS OF LEFT KNEE..  The various methods of treatment have been discussed with the patient and family. After consideration of risks, benefits and other options for treatment, the patient has consented to  Procedure(s): COMPUTER ASSISTED TOTAL KNEE ARTHROPLASTY - RNFA (Left) as a surgical intervention.  The patient's history has been reviewed, patient examined, no change in status, stable for surgery.  I have reviewed the patient's chart and labs.  Questions were answered to the patient's satisfaction.     [place]

## 2022-06-10 NOTE — Op Note (Signed)
OPERATIVE NOTE  DATE OF SURGERY:  06/10/2022  PATIENT NAME:  Katrina Brown   DOB: 1946/04/24  MRN: [id number]  PRE-OPERATIVE DIAGNOSIS: Degenerative arthrosis of the left knee, primary  POST-OPERATIVE DIAGNOSIS:  Same  PROCEDURE:  Left total knee arthroplasty using computer-assisted navigation  SURGEON:  Marciano Sequin. M.D.  ANESTHESIA: spinal  ESTIMATED BLOOD LOSS: 50 mL  FLUIDS REPLACED: 800 mL of crystalloid  TOURNIQUET TIME: 78 minutes  DRAINS: 2 medium Hemovac drains  SOFT TISSUE RELEASES: Anterior cruciate ligament, posterior cruciate ligament, deep medial collateral ligament, patellofemoral ligament  IMPLANTS UTILIZED: DePuy Attune size 4 posterior stabilized femoral component (cemented), size 3 rotating platform tibial component (cemented), 35 mm medialized dome patella (cemented), and a 6 mm stabilized rotating platform polyethylene insert.  INDICATIONS FOR SURGERY: Katrina Brown is a 77 y.o. year old female with a long history of progressive knee pain. X-rays demonstrated severe degenerative changes in tricompartmental fashion. The patient had not seen any significant improvement despite conservative nonsurgical intervention. After discussion of the risks and benefits of surgical intervention, the patient expressed understanding of the risks benefits and agree with plans for total knee arthroplasty.   The risks, benefits, and alternatives were discussed at length including but not limited to the risks of infection, bleeding, nerve injury, stiffness, blood clots, the need for revision surgery, cardiopulmonary complications, among others, and they were willing to proceed.  PROCEDURE IN DETAIL: The patient was brought into the operating room and, after adequate spinal anesthesia was achieved, a tourniquet was placed on the patient's upper thigh. The patient's knee and leg were cleaned and prepped with alcohol and DuraPrep and draped in the usual sterile fashion. A  "timeout" was performed as per usual protocol. The lower extremity was exsanguinated using an Esmarch, and the tourniquet was inflated to 300 mmHg. An anterior longitudinal incision was made followed by a standard mid vastus approach. The deep fibers of the medial collateral ligament were elevated in a subperiosteal fashion off of the medial flare of the tibia so as to maintain a continuous soft tissue sleeve. The patella was subluxed laterally and the patellofemoral ligament was incised. Inspection of the knee demonstrated severe degenerative changes with full-thickness loss of articular cartilage. Osteophytes were debrided using a rongeur. Anterior and posterior cruciate ligaments were excised. Two 4.0 mm Schanz pins were inserted in the femur and into the tibia for attachment of the array of trackers used for computer-assisted navigation. Hip center was identified using a circumduction technique. Distal landmarks were mapped using the computer. The distal femur and proximal tibia were mapped using the computer. The distal femoral cutting guide was positioned using computer-assisted navigation so as to achieve a 5 distal valgus cut. The femur was sized and it was felt that a size 4 femoral component was appropriate. A size 4 femoral cutting guide was positioned and the anterior cut was performed and verified using the computer. This was followed by completion of the posterior and chamfer cuts. Femoral cutting guide for the central box was then positioned in the center box cut was performed.  Attention was then directed to the proximal tibia. Medial and lateral menisci were excised. The extramedullary tibial cutting guide was positioned using computer-assisted navigation so as to achieve a 0 varus-valgus alignment and 3 posterior slope. The cut was performed and verified using the computer. The proximal tibia was sized and it was felt that a size 3 tibial tray was appropriate. Tibial and femoral trials were  inserted followed by insertion  of a 6 mm polyethylene insert. This allowed for excellent mediolateral soft tissue balancing both in flexion and in full extension. Finally, the patella was cut and prepared so as to accommodate a 35 mm medialized dome patella. A patella trial was placed and the knee was placed through a range of motion with excellent patellar tracking appreciated. The femoral trial was removed after debridement of posterior osteophytes. The central post-hole for the tibial component was reamed followed by insertion of a keel punch. Tibial trials were then removed. Cut surfaces of bone were irrigated with copious amounts of normal saline using pulsatile lavage and then suctioned dry. Polymethylmethacrylate cement was prepared in the usual fashion using a vacuum mixer. Cement was applied to the cut surface of the proximal tibia as well as along the undersurface of a size 3 rotating platform tibial component. Tibial component was positioned and impacted into place. Excess cement was removed using [employer] fast streamer. Cement was then applied to the cut surfaces of the femur as well as along the posterior flanges of the size 4 femoral component. The femoral component was positioned and impacted into place. Excess cement was removed using [employer] fast streamer. A 6 mm polyethylene trial was inserted and the knee was brought into full extension with steady axial compression applied. Finally, cement was applied to the backside of a 35 mm medialized dome patella and the patellar component was positioned and patellar clamp applied. Excess cement was removed using [employer] fast streamer. After adequate curing of the cement, the tourniquet was deflated after a total tourniquet time of 78 minutes. Hemostasis was achieved using electrocautery. The knee was irrigated with copious amounts of normal saline using pulsatile lavage followed by 450 ml of Surgiphor and then suctioned dry. 20 mL of 1.3% Exparel and 60 mL of 0.25% Marcaine  in 40 mL of normal saline was injected along the posterior capsule, medial and lateral gutters, and along the arthrotomy site. A 6 mm stabilized rotating platform polyethylene insert was inserted and the knee was placed through a range of motion with excellent mediolateral soft tissue balancing appreciated and excellent patellar tracking noted. 2 medium drains were placed in the wound bed and brought out through separate stab incisions. The medial parapatellar portion of the incision was reapproximated using interrupted sutures of #1 Vicryl. Subcutaneous tissue was approximated in layers using first #0 Vicryl followed #2-0 Vicryl. The skin was approximated with skin staples. A sterile dressing was applied.  The patient tolerated the procedure well and was transported to the recovery room in stable condition.    Esiah Bazinet P. Holley Bouche., M.D.

## 2022-06-11 DIAGNOSIS — M1712 Unilateral primary osteoarthritis, left knee: Secondary | ICD-10-CM | POA: Diagnosis not present

## 2022-06-11 MED ORDER — OXYCODONE HCL 5 MG PO TABS: 5.0000 mg | ORAL_TABLET | ORAL | 0 refills | Status: AC | PRN

## 2022-06-11 MED ORDER — CELECOXIB 200 MG PO CAPS: 200.0000 mg | ORAL_CAPSULE | Freq: Two times a day (BID) | ORAL | 1 refills | Status: AC

## 2022-06-11 MED ORDER — ENOXAPARIN SODIUM 40 MG/0.4ML IJ SOSY: 40.0000 mg | PREFILLED_SYRINGE | INTRAMUSCULAR | 0 refills | Status: AC

## 2022-06-11 MED ORDER — TRAMADOL HCL 50 MG PO TABS: 50.0000 mg | ORAL_TABLET | ORAL | 0 refills | Status: AC | PRN

## 2022-06-11 NOTE — TOC Initial Note (Addendum)
Transition of Care [facility]) - Initial/Assessment Note    Patient Details  Name: Katrina Brown MRN: [id number] Date of Birth: [birth year]  Transition of [facility]) CM/SW Contact:    Valente David, RN Phone Number: 06/11/2022, 10:23 AM  Clinical Narrative:                  Patient lives alone but state her son will stay with her for a few days after discharge.  He lives a few houses down from her and will provide transportation home when she is ready. Dr. Doy Hutching is PCP and obtains medications from  [facility] in [place].  She is aware of pending discharge today with recommendations of DME (bedside commode and rolling walker).  State she already has rolling walker, declines offer to order [facility].    Orders for PT/OT evaluation, per MD, PT eval needed prior to completing discharge.  TOC team will continue to follow for additional needs if necessary.    Update 1215: Notified patient will need HHPT.  Tommi Rumps with [facility] has accepted patient for services, patient aware.   Update 1252:  Notified that patient has already been set up with [facility] for [place].  Alwyn Ren notified of discharge today.  Cory with [facility] notified that [facility] will take patient instead.   Expected Discharge Plan: [place] Barriers to Discharge: Continued Medical Work up   Patient Goals and CMS Choice Patient states their goals for this hospitalization and ongoing recovery are:: Home with son's assistance          Expected Discharge Plan and Services       Living arrangements for the past 2 months: Single Family Home Expected Discharge Date: 06/11/22                                    Prior Living Arrangements/Services Living arrangements for the past 2 months: Single Family Home Lives with:: Self   Do you feel safe going back to the place where you live?: Yes      Need for Family Participation in Patient Care: Yes (Comment) Care giver support system in place?: Yes (comment) Current home  services: DME Criminal Activity/Legal Involvement Pertinent to Current Situation/Hospitalization: No - Comment as needed  Activities of Daily Living Home Assistive Devices/Equipment: None ADL Screening (condition at time of admission) Patient's cognitive ability adequate to safely complete daily activities?: Yes Is the patient deaf or have difficulty hearing?: No Does the patient have difficulty seeing, even when wearing glasses/contacts?: Yes Does the patient have difficulty concentrating, remembering, or making decisions?: No Patient able to express need for assistance with ADLs?: No Does the patient have difficulty dressing or bathing?: No Independently performs ADLs?: Yes (appropriate for developmental age) Does the patient have difficulty walking or climbing stairs?: No Weakness of Legs: Left Weakness of Arms/Hands: None  Permission Sought/Granted   Permission granted to share information with : Yes, Verbal Permission Granted              Emotional Assessment   Attitude/Demeanor/Rapport: Engaged Affect (typically observed): Calm Orientation: : Oriented to Self, Oriented to Place, Oriented to  Time, Oriented to Situation   Psych Involvement: No (comment)  Admission diagnosis:  Total knee replacement status [Z96.659] Patient Active Problem List   Diagnosis Date Noted   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   Primary osteoarthritis of left knee 04/10/2022   Prediabetes 10/19/2021   Vitamin D  deficiency 09/15/2020   Tinnitus of left ear 05/09/2018   Cerebral aneurysm 05/08/2018   Status post anterior colporrhaphy with enterocele ligation 08/16/2017   Clinical depression 12/23/2014   Calculus of kidney 12/23/2014   Chest pain, non-cardiac 12/23/2014   Menopause 12/23/2014   Prolapse of female pelvic organs 12/23/2014   Insomnia, persistent 10/25/2013   Essential (primary) hypertension 10/25/2013   Combined fat and carbohydrate induced hyperlipemia 10/25/2013   MI  (mitral incompetence) 10/25/2013   Episode of syncope 10/25/2013   PCP:  Idelle Crouch, MD Pharmacy:   [facility] [address] [place] [address] Phone: [phone] Fax: [phone]  [place], [place] [address] Phone: [phone] Fax: [phone]  [facility] [address] [place] [address] Phone: [phone] Fax: [phone]     Social Determinants of Health (SDOH) Social History: SDOH Screenings   Food Insecurity: No Food Insecurity (06/10/2022)  Housing: Low Risk  (06/10/2022)  Transportation Needs: No Transportation Needs (06/10/2022)  Utilities: Not At Risk (06/10/2022)  Tobacco Use: Medium Risk (06/10/2022)   SDOH Interventions:     Readmission Risk Interventions     No data to display

## 2022-06-11 NOTE — Discharge Summary (Signed)
Physician Discharge Summary  Subjective: 1 Day Post-Op Procedure(s) (LRB): COMPUTER ASSISTED TOTAL KNEE ARTHROPLASTY (Left) Patient reports pain as mild.   Patient seen in rounds with Dr. Marry Guan. Patient is well, and has had no acute complaints or problems Patient is ready to go home with home health physical therapy.  Physician Discharge Summary  Patient ID: Katrina Brown MRN: [id number] DOB/AGE: [birth year] 77 y.o.  Admit date: 06/10/2022 Discharge date: 06/11/2022  Admission Diagnoses:  Discharge Diagnoses:  Principal Problem:   Total knee replacement status   Discharged Condition: fair  Hospital Course: The patient is postop day 1 from a left total knee arthroplasty.  She is doing well since surgery.  Her vitals have remained stable.  Her pain is manageable.  Her Hemovac was removed this morning.  She is ready to do physical therapy this morning and then will go home after lunch.  Treatments: surgery:   Left total knee arthroplasty using computer-assisted navigation   SURGEON:  Marciano Sequin. M.D.   ANESTHESIA: spinal   ESTIMATED BLOOD LOSS: 50 mL   FLUIDS REPLACED: 800 mL of crystalloid   TOURNIQUET TIME: 78 minutes   DRAINS: 2 medium Hemovac drains   SOFT TISSUE RELEASES: Anterior cruciate ligament, posterior cruciate ligament, deep medial collateral ligament, patellofemoral ligament   IMPLANTS UTILIZED: DePuy Attune size 4 posterior stabilized femoral component (cemented), size 3 rotating platform tibial component (cemented), 35 mm medialized dome patella (cemented), and a 6 mm stabilized rotating platform polyethylene insert.  Discharge Exam: Blood pressure 126/60, pulse 65, temperature 98 F (36.7 C), resp. rate 18, height 5' 2"$  (1.575 m), weight 55.3 kg, SpO2 99 %.   Disposition: Discharge disposition: 01-Home or Self Care        Allergies as of 06/11/2022       Reactions   Penicillins Other (See Comments)   IgE = 31 (WNL) on  05/30/2021. Unsure of reaction.   Has patient had a PCN reaction: 1) causing immediate rash, facial/tongue/throat swelling, SOB or lightheadedness with hypotension:unsure0 2) causing severe rash involving mucus membranes or skin necrosis:unsure 3) that required hospitalization:No 4) occurring within the last 10 years:No If all of the above answers are "NO", then may proceed with Cephalosporin use. Childhood reaction.        Medication List     TAKE these medications    ADULT NUTRITIONAL SUPPLEMENT PO Take 4 capsules by mouth daily. Nutrafol Women's Balance Hair Growth   aspirin EC 81 MG tablet Take by mouth.   Calcium 600+D 600-20 MG-MCG Tabs Generic drug: Calcium Carb-Cholecalciferol Take 1 tablet by mouth daily.   celecoxib 200 MG capsule Commonly known as: CELEBREX Take 1 capsule (200 mg total) by mouth 2 (two) times daily.   COLLAGEN PO Take 4 Doses by mouth daily. Vital Proteins Gummies   enoxaparin 40 MG/0.4ML injection Commonly known as: LOVENOX Inject 0.4 mLs (40 mg total) into the skin daily for 14 days.   Fish Oil 1000 MG Caps Take 1,000 mg by mouth daily.   oxyCODONE 5 MG immediate release tablet Commonly known as: Oxy IR/ROXICODONE Take 1 tablet (5 mg total) by mouth every 4 (four) hours as needed for moderate pain (pain score 4-6).   propranolol ER 60 MG 24 hr capsule Commonly known as: INDERAL LA Take 60 mg by mouth daily.   traMADol 50 MG tablet Commonly known as: ULTRAM Take 1-2 tablets (50-100 mg total) by mouth every 4 (four) hours as needed for moderate pain.   Turmeric 500  MG Caps Take 500 mg by mouth daily.   Vitamin B-12 5000 MCG Tbdp Take 1 tablet by mouth daily.   Vitamin D-3 125 MCG (5000 UT) Tabs Take 1 capsule by mouth daily.   vitamin E 1000 UNIT capsule Take 1,000 Units by mouth daily.               Durable Medical Equipment  (From admission, onward)           Start     Ordered   06/10/22 1655  DME Walker  rolling  Once       Question:  Patient needs a walker to treat with the following condition  Answer:  Total knee replacement status   06/10/22 1654   06/10/22 1655  DME Bedside commode  Once       Comments: Patient is not able to walk the distance required to go the bathroom, or he/she is unable to safely negotiate stairs required to access the bathroom.  A 3in1 BSC will alleviate this problem  Question:  Patient needs a bedside commode to treat with the following condition  Answer:  Total knee replacement status   06/10/22 1654            Follow-up Information     Watt Climes, PA Follow up on 06/27/2022.   Specialty: Physician Assistant Why: at 10:15am Contact information: [place] [address] [phone]         Dereck Leep, MD Follow up on 07/21/2022.   Specialty: Orthopedic Surgery Why: at 9:45am Contact information: [address] [facility] [place] [address] [phone]                 Signed: Prescott Parma, Brianda Beitler 06/11/2022, 7:11 AM   Objective: Vital signs in last 24 hours: Temp:  [97.4 F (36.3 C)-98.3 F (36.8 C)] 98 F (36.7 C) (02/10 0441) Pulse Rate:  [54-70] 65 (02/10 0441) Resp:  [10-18] 18 (02/10 0441) BP: (111-162)/(54-68) 126/60 (02/10 0441) SpO2:  [95 %-100 %] 99 % (02/10 0441) Weight:  [55.3 kg] 55.3 kg (02/09 1034)  Intake/Output from previous day:  Intake/Output Summary (Last 24 hours) at 06/11/2022 0711 Last data filed at 06/11/2022 0100 Gross per 24 hour  Intake 1250 ml  Output 550 ml  Net 700 ml    Intake/Output this shift: No intake/output data recorded.  Labs: No results for input(s): "HGB" in the last 72 hours. No results for input(s): "WBC", "RBC", "HCT", "PLT" in the last 72 hours. No results for input(s): "NA", "K", "CL", "CO2", "BUN", "CREATININE", "GLUCOSE", "CALCIUM" in the last 72 hours. No results for input(s): "LABPT", "INR" in the last 72 hours.  EXAM: General -  Patient is Alert and Oriented Extremity - Neurovascular intact Sensation intact distally Dorsiflexion/Plantar flexion intact Compartment soft Incision - clean, dry, with a Hemovac removed without complication. Motor Function -plantarflexion and dorsiflexion are intact.  Able to straight leg raise independently.  Assessment/Plan: 1 Day Post-Op Procedure(s) (LRB): COMPUTER ASSISTED TOTAL KNEE ARTHROPLASTY (Left) Procedure(s) (LRB): COMPUTER ASSISTED TOTAL KNEE ARTHROPLASTY (Left) Past Medical History:  Diagnosis Date   Arthritis    Cystocele    Depression    History of kidney stones    Hx of mitral valve insufficiency    Hypertension    Insomnia    Menopausal and perimenopausal disorder    Rectocele    Vaginal enterocele    Vaginal Pap smear, abnormal    Principal Problem:   Total  knee replacement status  Estimated body mass index is 22.3 kg/m as calculated from the following:   Height as of this encounter: 5' 2"$  (1.575 m).   Weight as of this encounter: 55.3 kg. Advance diet Up with therapy D/C IV fluids Discharge home with home health Diet - Regular diet Follow up - in 2 weeks Activity - WBAT Disposition - Home Condition Upon Discharge - Stable DVT Prophylaxis - Lovenox and TED hose  Reche Dixon, PA-C Orthopaedic Surgery 06/11/2022, 7:11 AM

## 2022-06-11 NOTE — Evaluation (Signed)
Occupational Therapy Evaluation Patient Details Name: Katrina Brown MRN: [id number] DOB: [birth year] Today's Date: 06/11/2022   History of Present Illness Patient s/p L TKA .   Clinical Impression   Pt is a 77 yo female admitted to [facility] for left TKA.  Pt reports she lives alone with a son nearby who is able to stay with her as needed.  Pt lives in a one story home with 2-3 steps to enter, she was independent prior to admission with all ADL and IADL tasks including driving.  She was seen for OT evaluation this date, able to complete lower body dressing with modified independence, toileting with supervision to and from the bathroom with a rolling walker.  She has a walker at home and will not need a BSC (able to perform toilet transfer without use of grab bars from standard height. )  Pt educated on lower body dressing techniques and able to demonstrate understanding.  She has no further OT needs at this time.       Recommendations for follow up therapy are one component of a multi-disciplinary discharge planning process, led by the attending physician.  Recommendations may be updated based on patient status, additional functional criteria and insurance authorization.   Follow Up Recommendations  No OT follow up     Assistance Recommended at Discharge Intermittent Supervision/Assistance  Patient can return home with the following Assist for transportation;Assistance with cooking/housework    Functional Status Assessment  Patient has had a recent decline in their functional status and demonstrates the ability to make significant improvements in function in a reasonable and predictable amount of time.  Equipment Recommendations       Recommendations for Other Services       Precautions / Restrictions Precautions Precautions: Fall Restrictions Weight Bearing Restrictions: Yes      Mobility Bed Mobility               General bed mobility comments: pt up to the chair on  arrival    Transfers Overall transfer level: Needs assistance Equipment used: Rolling walker (2 wheels) Transfers: Sit to/from Stand Sit to Stand: Supervision                  Balance Overall balance assessment: Modified Independent Sitting-balance support: Feet supported Sitting balance-Leahy Scale: Normal       Standing balance-Leahy Scale: Good                             ADL either performed or assessed with clinical judgement   ADL Overall ADL's : Modified independent                                       General ADL Comments: Pt was able to demonstrate toileting, lower body dressing with modified independence.  Ambulated to and from bathroom with rolling walker and supervision     Vision Baseline Vision/History: 1 Wears glasses       Perception     Praxis      Pertinent Vitals/Pain Pain Assessment Pain Assessment: 0-10 Pain Score: 3      Hand Dominance Right   Extremity/Trunk Assessment Upper Extremity Assessment Upper Extremity Assessment: Overall WFL for tasks assessed   Lower Extremity Assessment Lower Extremity Assessment: Defer to PT evaluation LLE Coordination: decreased gross motor   Cervical / Trunk Assessment Cervical / Trunk  Assessment: Normal   Communication Communication Communication: No difficulties   Cognition Arousal/Alertness: Awake/alert Behavior During Therapy: WFL for tasks assessed/performed Overall Cognitive Status: Within Functional Limits for tasks assessed                                       General Comments   Pt seen for toileting with supervision to and from bathroom with use of RW, lower body dressing with modified independence.      Exercises     Shoulder Instructions      Home Living Family/patient expects to be discharged to:: Private residence Living Arrangements: Alone Available Help at Discharge: Family;Available 24 hours/day Type of Home: House Home  Access: Stairs to enter [employer] of Steps: 1-2 Entrance Stairs-Rails: Right;Left Home Layout: One level     Bathroom Shower/Tub: Teacher, early years/pre: Standard     Home Equipment: Conservation officer, nature (2 wheels)   Additional Comments: Pt reports her son will be able to stay with her for a few days post discharge      Prior Functioning/Environment Prior Level of Function : Independent/Modified Independent;Driving             Mobility Comments: independent ADLs Comments: independent        OT Problem List: Decreased strength;Decreased range of motion      OT Treatment/Interventions:      OT Goals(Current goals can be found in the care plan section) Acute Rehab OT Goals Patient Stated Goal: pt would like to return home and be able to care for herself independently OT Goal Formulation: All assessment and education complete, DC therapy Time For Goal Achievement: 06/11/22 Potential to Achieve Goals: Good ADL Goals Pt Will Transfer to Toilet: (P) with supervision  OT Frequency:      Co-evaluation              AM-PAC OT "6 Clicks" Daily Activity     Outcome Measure Help from another person eating meals?: None Help from another person taking care of personal grooming?: None Help from another person toileting, which includes using toliet, bedpan, or urinal?: None Help from another person bathing (including washing, rinsing, drying)?: None Help from another person to put on and taking off regular upper body clothing?: None Help from another person to put on and taking off regular lower body clothing?: None 6 Click Score: 24   End of Session Equipment Utilized During Treatment: Gait belt;Rolling walker (2 wheels)  Activity Tolerance: Patient tolerated treatment well Patient left: in chair;with call bell/phone within reach  OT Visit Diagnosis: Muscle weakness (generalized) (M62.81)                Time: [id number] OT Time Calculation (min):  23 min Charges:  OT General Charges $OT Visit: 1 Visit OT Evaluation $OT Eval Low Complexity: 1 Low OT Treatments $Self Care/Home Management : 8-22 mins  Calee Nugent T Amanuel Sinkfield, OTR/L, CLT Oceanna Arruda 06/11/2022, 12:03 PM

## 2022-06-11 NOTE — Anesthesia Postprocedure Evaluation (Signed)
Anesthesia Post Note  Patient: Tree surgeon  Procedure(s) Performed: COMPUTER ASSISTED TOTAL KNEE ARTHROPLASTY (Left: Knee)  Patient location during evaluation: Nursing Unit Anesthesia Type: Spinal Level of consciousness: oriented and awake and alert Pain management: pain level controlled Vital Signs Assessment: post-procedure vital signs reviewed and stable Respiratory status: spontaneous breathing and respiratory function stable Cardiovascular status: blood pressure returned to baseline and stable Postop Assessment: no headache, no backache, no apparent nausea or vomiting and patient able to bend at knees Anesthetic complications: no   No notable events documented.   Last Vitals:  Vitals:   06/11/22 0441 06/11/22 0809  BP: 126/60 (!) 104/53  Pulse: 65 64  Resp: 18 15  Temp: 36.7 C 36.8 C  SpO2: 99% 100%    Last Pain:  Vitals:   06/11/22 0750  TempSrc:   PainSc: 0-No pain                 Arita Miss

## 2022-06-11 NOTE — Progress Notes (Signed)
Physical Therapy Treatment Patient Details Name: Katrina Brown MRN: [id number] DOB: 02/29/1946 Today's Date: 06/11/2022   History of Present Illness Patient s/p L TKA .    PT Comments    Focus of 2nd PT session was supine L LE strengthening there ex.  Pt performed with min cues.  Pt declined OOB bed activity reporting that she had already walked this morning, awaiting D/C, and would like to stay in bed. Current PT D/C plan is appropriate and there are no barriers to D/C.  Recommendations for follow up therapy are one component of a multi-disciplinary discharge planning process, led by the attending physician.  Recommendations may be updated based on patient status, additional functional criteria and insurance authorization.  Follow Up Recommendations  Home health PT     Assistance Recommended at Discharge PRN  Patient can return home with the following A little help with walking and/or transfers;A little help with bathing/dressing/bathroom;Assist for transportation;Help with stairs or ramp for entrance;Assistance with cooking/housework   Equipment Recommendations  BSC/3in1    Recommendations for Other Services       Precautions / Restrictions Precautions Precautions: Fall Restrictions Weight Bearing Restrictions: Yes     Mobility  Bed Mobility               General bed mobility comments: Pt declined OOB bed activity reporting that she had already walked this morning, awaiting D/C, and would like to stay in bed.    Transfers                   General transfer comment: Pt declined OOB bed activity reporting that she had already walked this morning, awaiting D/C, and would like to stay in bed.    Ambulation/Gait               General Gait Details: Pt declined OOB bed activity reporting that she had already walked this morning, awaiting D/C, and would like to stay in bed.   Stairs             Wheelchair Mobility    Modified Rankin (Stroke  Patients Only)       Balance       Sitting balance - Comments: Pt declined OOB bed activity reporting that she had already walked this morning, awaiting D/C, and would like to stay in bed.       Standing balance comment: Pt declined OOB bed activity reporting that she had already walked this morning, awaiting D/C, and would like to stay in bed.                            Cognition Arousal/Alertness: Awake/alert Behavior During Therapy: WFL for tasks assessed/performed Overall Cognitive Status: Within Functional Limits for tasks assessed                                          Exercises Total Joint Exercises Ankle Circles/Pumps: AROM, Both, 10 reps Quad Sets: AROM, Both, 10 reps Gluteal Sets: AROM, Both, 10 reps Hip ABduction/ADduction: AAROM, Strengthening, Left, 10 reps Other Exercises Other Exercises: L hip abd/add AAROM ( with sheet) x10    General Comments        Pertinent Vitals/Pain Pain Assessment Pain Assessment: Faces Faces Pain Scale: Hurts a little bit Pain Location: knee Pain Descriptors / Indicators: Sore    Home Living  Family/patient expects to be discharged to:: Private residence Living Arrangements: Alone Available Help at Discharge: Family;Available 24 hours/day Type of Home: House Home Access: Stairs to enter Entrance Stairs-Rails: Psychiatric nurse of Steps: 1-2   Home Layout: One level Home Equipment: Conservation officer, nature (2 wheels) Additional Comments: Pt reports her son will be able to stay with her for a few days post discharge    Prior Function            PT Goals (current goals can now be found in the care plan section) Acute Rehab PT Goals Patient Stated Goal: to return home today PT Goal Formulation: With patient Time For Goal Achievement: 06/18/22 Potential to Achieve Goals: Good Progress towards PT goals: Progressing toward goals    Frequency    BID      PT Plan       Co-evaluation              AM-PAC PT "6 Clicks" Mobility   Outcome Measure  Help needed turning from your back to your side while in a flat bed without using bedrails?: None Help needed moving from lying on your back to sitting on the side of a flat bed without using bedrails?: None Help needed moving to and from a bed to a chair (including a wheelchair)?: A Little Help needed standing up from a chair using your arms (e.g., wheelchair or bedside chair)?: A Little Help needed to walk in hospital room?: A Little Help needed climbing 3-5 steps with a railing? : A Little 6 Click Score: 17    End of Session Equipment Utilized During Treatment: Gait belt Activity Tolerance: Patient tolerated treatment well Patient left: in bed;with call bell/phone within reach Nurse Communication: Mobility status;Other (comment) PT Visit Diagnosis: Other abnormalities of gait and mobility (R26.89);Difficulty in walking, not elsewhere classified (R26.2);Pain Pain - Right/Left: Left Pain - part of body: Knee     Time: 1211-1228 PT Time Calculation (min) (ACUTE ONLY): 17 min  Charges:  $Gait Training: 8-22 mins $Therapeutic Exercise: 8-22 mins                     Bjorn Loser, PTA  06/11/22, 12:43 PM

## 2022-06-11 NOTE — Evaluation (Signed)
Physical Therapy Evaluation Patient Details Name: Katrina Brown MRN: [id number] DOB: [birth year] Today's Date: 06/11/2022  History of Present Illness  Patient s/p L TKA .  Clinical Impression  Patient received in recliner getting dressed. Reports mild pain. Agreeable to PT evaluation. Patient is supervision for sit to stand and ambulated 300 feet with RW and supervision. She ambulated up/down 2 -3 steps with single rail and supervision. Patient moving well and ready to go home when discharged. Son will be picking her up. She will continue to benefit from skilled PT to improve functional independence.        Recommendations for follow up therapy are one component of a multi-disciplinary discharge planning process, led by the attending physician.  Recommendations may be updated based on patient status, additional functional criteria and insurance authorization.  Follow Up Recommendations Home health PT      Assistance Recommended at Discharge PRN  Patient can return home with the following  A little help with walking and/or transfers;A little help with bathing/dressing/bathroom;Assist for transportation;Help with stairs or ramp for entrance;Assistance with cooking/housework    Equipment Recommendations BSC/3in1 (patient reports she has walker)  Recommendations for Other Services       Functional Status Assessment Patient has had a recent decline in their functional status and demonstrates the ability to make significant improvements in function in a reasonable and predictable amount of time.     Precautions / Restrictions Precautions Precautions: Fall Restrictions Weight Bearing Restrictions: Yes      Mobility  Bed Mobility               General bed mobility comments: not assessed, patient in recliner getting dressed upon arrival    Transfers Overall transfer level: Needs assistance Equipment used: Rolling walker (2 wheels) Transfers: Sit to/from Stand Sit to Stand:  Supervision                Ambulation/Gait Ambulation/Gait assistance: Supervision Gait Distance (Feet): 300 Feet Assistive device: Rolling walker (2 wheels) Gait Pattern/deviations: Step-through pattern, Decreased step length - right, Decreased step length - left, Decreased stride length, Antalgic Gait velocity: decr     General Gait Details: slightly antalgic due to soreness. No lob or knee buckling.  Stairs            Wheelchair Mobility    Modified Rankin (Stroke Patients Only)       Balance Overall balance assessment: Needs assistance Sitting-balance support: Feet supported Sitting balance-Leahy Scale: Normal     Standing balance support: Bilateral upper extremity supported, During functional activity, Reliant on assistive device for balance Standing balance-Leahy Scale: Good                               Pertinent Vitals/Pain Pain Assessment Pain Assessment: Faces Faces Pain Scale: Hurts a little bit Pain Location: knee Pain Descriptors / Indicators: Sore Pain Intervention(s): Monitored during session, Premedicated before session, Repositioned, Ice applied    Home Living Family/patient expects to be discharged to:: Private residence Living Arrangements: Alone Available Help at Discharge: Family;Available 24 hours/day Type of Home: House Home Access: Stairs to enter Entrance Stairs-Rails: Psychiatric nurse of Steps: 1-2   Home Layout: One level Home Equipment: Conservation officer, nature (2 wheels)      Prior Function Prior Level of Function : Independent/Modified Independent;Driving             Mobility Comments: independent ADLs Comments: independent     Hand  Dominance        Extremity/Trunk Assessment   Upper Extremity Assessment Upper Extremity Assessment: Defer to OT evaluation    Lower Extremity Assessment Lower Extremity Assessment: LLE deficits/detail LLE Coordination: decreased gross motor     Cervical / Trunk Assessment Cervical / Trunk Assessment: Normal  Communication   Communication: No difficulties  Cognition Arousal/Alertness: Awake/alert Behavior During Therapy: WFL for tasks assessed/performed Overall Cognitive Status: Within Functional Limits for tasks assessed                                          General Comments      Exercises Total Joint Exercises Ankle Circles/Pumps: AROM, Both, 10 reps Quad Sets: AROM, Both, 10 reps Knee Flexion: AROM, Left, 5 reps Goniometric ROM: 0-87 degrees   Assessment/Plan    PT Assessment Patient needs continued PT services  PT Problem List Decreased strength;Decreased range of motion;Decreased activity tolerance;Decreased mobility;Decreased skin integrity;Pain;Decreased balance       PT Treatment Interventions DME instruction;Therapeutic exercise;Gait training;Stair training;Functional mobility training;Therapeutic activities;Patient/family education;Neuromuscular re-education;Modalities    PT Goals (Current goals can be found in the Care Plan section)  Acute Rehab PT Goals Patient Stated Goal: to return home today PT Goal Formulation: With patient Time For Goal Achievement: 06/18/22 Potential to Achieve Goals: Good    Frequency BID     Co-evaluation               AM-PAC PT "6 Clicks" Mobility  Outcome Measure Help needed turning from your back to your side while in a flat bed without using bedrails?: None Help needed moving from lying on your back to sitting on the side of a flat bed without using bedrails?: None Help needed moving to and from a bed to a chair (including a wheelchair)?: A Little Help needed standing up from a chair using your arms (e.g., wheelchair or bedside chair)?: A Little Help needed to walk in hospital room?: A Little Help needed climbing 3-5 steps with a railing? : A Little 6 Click Score: 20    End of Session Equipment Utilized During Treatment: Gait  belt Activity Tolerance: Patient tolerated treatment well Patient left: in chair;with call bell/phone within reach Nurse Communication: Mobility status;Other (comment) (dressing on knee with heavy bleeding after ambulation) PT Visit Diagnosis: Other abnormalities of gait and mobility (R26.89);Difficulty in walking, not elsewhere classified (R26.2);Pain Pain - Right/Left: Left Pain - part of body: Knee    Time: 0930-1000 PT Time Calculation (min) (ACUTE ONLY): 30 min   Charges:   PT Evaluation $PT Eval Moderate Complexity: 1 Mod PT Treatments $Gait Training: 8-22 mins        Shuntae Herzig, PT, GCS 06/11/22,10:02 AM

## 2022-06-11 NOTE — Progress Notes (Signed)
Pt has DC order after PT has cleared the pt. TOC has HHPT set up. Pt refused RW and BSC. AVS was given and explained to pt. Pt is to be DC with Lovenox Injection for 2week, pt said she cannot give herself injection, son will come to pick-up pt for DC, RN will educate son with the injection.

## 2022-06-11 NOTE — Progress Notes (Signed)
  Subjective: 1 Day Post-Op Procedure(s) (LRB): COMPUTER ASSISTED TOTAL KNEE ARTHROPLASTY (Left) Patient reports pain as mild.   Patient is well, and has had no acute complaints or problems Plan is to go Home after hospital stay. Negative for chest pain and shortness of breath Fever: no Gastrointestinal: Negative for nausea and vomiting  Objective: Vital signs in last 24 hours: Temp:  [97.4 F (36.3 C)-98.3 F (36.8 C)] 98 F (36.7 C) (02/10 0441) Pulse Rate:  [54-70] 65 (02/10 0441) Resp:  [10-18] 18 (02/10 0441) BP: (111-162)/(54-68) 126/60 (02/10 0441) SpO2:  [95 %-100 %] 99 % (02/10 0441) Weight:  [55.3 kg] 55.3 kg (02/09 1034)  Intake/Output from previous day:  Intake/Output Summary (Last 24 hours) at 06/11/2022 0708 Last data filed at 06/11/2022 0100 Gross per 24 hour  Intake 1250 ml  Output 550 ml  Net 700 ml    Intake/Output this shift: No intake/output data recorded.  Labs: No results for input(s): "HGB" in the last 72 hours. No results for input(s): "WBC", "RBC", "HCT", "PLT" in the last 72 hours. No results for input(s): "NA", "K", "CL", "CO2", "BUN", "CREATININE", "GLUCOSE", "CALCIUM" in the last 72 hours. No results for input(s): "LABPT", "INR" in the last 72 hours.   EXAM General - Patient is Alert and Oriented Extremity - Neurovascular intact Sensation intact distally Dorsiflexion/Plantar flexion intact Compartment soft Dressing/Incision - clean, dry, with the Hemovac tubing removed with no complication.  The Hemovac tubing was intact on removal. Motor Function - intact, moving foot and toes well on exam.  Able to straight leg raise independently.  Past Medical History:  Diagnosis Date   Arthritis    Cystocele    Depression    History of kidney stones    Hx of mitral valve insufficiency    Hypertension    Insomnia    Menopausal and perimenopausal disorder    Rectocele    Vaginal enterocele    Vaginal Pap smear, abnormal      Assessment/Plan: 1 Day Post-Op Procedure(s) (LRB): COMPUTER ASSISTED TOTAL KNEE ARTHROPLASTY (Left) Principal Problem:   Total knee replacement status  Estimated body mass index is 22.3 kg/m as calculated from the following:   Height as of this encounter: 5\' 2"  (1.575 m).   Weight as of this encounter: 55.3 kg. Advance diet Up with therapy D/C IV fluids Discharge home with home health  DVT Prophylaxis - Lovenox, Foot Pumps, and TED hose Weight-Bearing as tolerated to left leg  Katrina Dixon, PA-C Orthopaedic Surgery 06/11/2022, 7:08 AM

## 2022-06-11 NOTE — Progress Notes (Signed)
Patient is not able to walk the distance required to go the bathroom, or he/she is unable to safely negotiate stairs required to access the bathroom.  A 3in1 BSC will alleviate this problem  Kizer Nobbe P. Holley Bouche M.D.

## 2022-06-13 ENCOUNTER — Encounter: Payer: Self-pay | Admitting: Orthopedic Surgery

## 2022-08-26 ENCOUNTER — Ambulatory Visit
Admission: RE | Admit: 2022-08-26 | Discharge: 2022-08-26 | Disposition: A | Discharge status: Home / Self Care | Payer: Medicare Other | Source: Ambulatory Visit | Attending: Internal Medicine | Admitting: Internal Medicine

## 2022-08-26 DIAGNOSIS — Z1231 Encounter for screening mammogram for malignant neoplasm of breast: Secondary | ICD-10-CM | POA: Diagnosis not present

## 2022-08-31 ENCOUNTER — Other Ambulatory Visit: Payer: Self-pay | Admitting: Internal Medicine

## 2022-08-31 DIAGNOSIS — R928 Other abnormal and inconclusive findings on diagnostic imaging of breast: Secondary | ICD-10-CM

## 2022-08-31 DIAGNOSIS — N6489 Other specified disorders of breast: Secondary | ICD-10-CM

## 2022-09-07 ENCOUNTER — Ambulatory Visit
Admission: RE | Admit: 2022-09-07 | Discharge: 2022-09-07 | Disposition: A | Discharge status: Home / Self Care | Payer: Medicare Other | Source: Ambulatory Visit | Attending: Internal Medicine | Admitting: Internal Medicine

## 2022-09-07 DIAGNOSIS — R928 Other abnormal and inconclusive findings on diagnostic imaging of breast: Secondary | ICD-10-CM

## 2022-09-07 DIAGNOSIS — N6489 Other specified disorders of breast: Secondary | ICD-10-CM

## 2022-09-09 ENCOUNTER — Ambulatory Visit: Payer: Medicare Other | Admitting: Podiatry

## 2022-09-09 ENCOUNTER — Encounter: Payer: Self-pay | Admitting: Podiatry

## 2022-09-09 DIAGNOSIS — L603 Nail dystrophy: Secondary | ICD-10-CM

## 2022-09-09 NOTE — Progress Notes (Signed)
Chief Complaint  Patient presents with   Nail Problem    "I had this toenail removed last year.  It's been extremely long to grow back.  It has started to grown in."    HPI: 77 y.o. female presenting today for evaluation of some tenderness with thickening and discoloration to the right hallux nail plate.  Patient does have a history of total temporary nail avulsion to the right hallux nail plate.  She says that over the past year as it has grown she is slowly noticed some pain and tenderness to the distal tip of the nail plate.  Over the past week it has felt better but she presents for further treatment evaluation  Past Medical History:  Diagnosis Date   Arthritis    Cystocele    Depression    History of kidney stones    Hx of mitral valve insufficiency    Hypertension    Insomnia    Menopausal and perimenopausal disorder    Rectocele    Vaginal enterocele    Vaginal Pap smear, abnormal     Past Surgical History:  Procedure Laterality Date   ABDOMINAL HYSTERECTOMY  1976   tah/bso   ANTERIOR AND POSTERIOR VAGINAL REPAIR  05/12/2014   APPENDECTOMY     CEREBRAL ANEURYSM REPAIR     CYSTOCELE REPAIR N/A 08/07/2017   Procedure: ANTERIOR REPAIR (CYSTOCELE);  Surgeon: Herold Harms, MD;  Location: [facility];  Service: Gynecology;  Laterality: N/A;  with enterocele ligation and kelly plication   ENTEROCELE REPAIR  05/2014   KNEE ARTHROPLASTY Left 06/10/2022   Procedure: COMPUTER ASSISTED TOTAL KNEE ARTHROPLASTY;  Surgeon: Donato Heinz, MD;  Location: [facility];  Service: Orthopedics;  Laterality: Left;   KNEE ARTHROSCOPY Left 06/27/2016   Procedure: ARTHROSCOPY LEFT  KNEE, PARTIAL MEDIAL MENISECTOMY, MEDIAL CHONDROPLASTY;  Surgeon: Donato Heinz, MD;  Location: [facility];  Service: Orthopedics;  Laterality: Left;   KNEE SURGERY Right 2010    Allergies  Allergen Reactions   Penicillins Other (See Comments)    IgE = 31 (WNL) on 05/30/2021. Unsure of reaction.   Has  patient had a PCN reaction: 1) causing immediate rash, facial/tongue/throat swelling, SOB or lightheadedness with hypotension:unsure0 2) causing severe rash involving mucus membranes or skin necrosis:unsure 3) that required hospitalization:No 4) occurring within the last 10 years:No If all of the above answers are "NO", then may proceed with Cephalosporin use. Childhood reaction.     Physical Exam: General: The patient is alert and oriented x3 in no acute distress.  Dermatology: Skin is warm, dry and supple bilateral lower extremities.  Slight hyperkeratotic dystrophic nail plate noted to the distal portion of the right hallux  Vascular: Palpable pedal pulses bilaterally. Capillary refill within normal limits.  No appreciable edema.  No erythema.  Neurological: Grossly intact via light touch  Musculoskeletal Exam: No pedal deformities noted  Assessment/Plan of Care: 1.  Symptomatic dystrophic nail right hallux nail plate  -Light mechanical debridement of the nail plate was performed today over the distal portion of the nail using a nail nipper without incident or bleeding.  Smoothed with a rotary bur -Patient felt significant relief -Continue to allow the nails to grow with time -OTC Tolcylen antifungal topical dispensed to apply daily -Return to clinic as needed       Felecia Shelling, DPM [facility]  Dr. Felecia Shelling, DPM    [address]  [place], [place] [address]                Office [phone]  Fax [phone]

## 2023-04-14 ENCOUNTER — Other Ambulatory Visit: Payer: Self-pay | Admitting: Internal Medicine

## 2023-04-14 DIAGNOSIS — Z1231 Encounter for screening mammogram for malignant neoplasm of breast: Secondary | ICD-10-CM

## 2023-06-06 ENCOUNTER — Ambulatory Visit
Admission: EM | Admit: 2023-06-06 | Discharge: 2023-06-06 | Disposition: A | Discharge status: Home / Self Care | Payer: Medicare Other

## 2023-06-06 ENCOUNTER — Encounter: Payer: Self-pay | Admitting: Emergency Medicine

## 2023-06-06 DIAGNOSIS — H6122 Impacted cerumen, left ear: Secondary | ICD-10-CM | POA: Diagnosis not present

## 2023-06-06 NOTE — Discharge Instructions (Addendum)
Return if your symptoms return.

## 2023-06-06 NOTE — ED Triage Notes (Signed)
Pt has been putting q-tips in her left ear and now can't hear x 3 days

## 2023-06-06 NOTE — ED Provider Notes (Signed)
 [facility]    CSN: [id number] Arrival date & time: 06/06/23  1402      History   Chief Complaint Chief Complaint  Patient presents with   Ear Fullness    HPI Katrina Brown is a 78 y.o. female.   HPI  78 year old female past medical history significant for hypertension, persistent insomnia, prediabetes, tinnitus in the left ear, and mitral valve incompetence presents for evaluation of fullness in her left ear.  She reports that it started 3 days ago and has not shown any drainage or fever.  She does report decreased hearing in her left ear.  She reports that she has been using Q-tips and she thinks she might have blocked her ear canal.  Past Medical History:  Diagnosis Date   Arthritis    Cystocele    Depression    History of kidney stones    Hx of mitral valve insufficiency    Hypertension    Insomnia    Menopausal and perimenopausal disorder    Rectocele    Vaginal enterocele    Vaginal Pap smear, abnormal     Patient Active Problem List   Diagnosis Date Noted   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   Primary osteoarthritis of left knee 04/10/2022   Prediabetes 10/19/2021   Vitamin D deficiency 09/15/2020   Tinnitus of left ear 05/09/2018   Cerebral aneurysm 05/08/2018   Status post anterior colporrhaphy with enterocele ligation 08/16/2017   Clinical depression 12/23/2014   Calculus of kidney 12/23/2014   Chest pain, non-cardiac 12/23/2014   Menopause 12/23/2014   Prolapse of female pelvic organs 12/23/2014   Insomnia, persistent 10/25/2013   Essential (primary) hypertension 10/25/2013   Combined fat and carbohydrate induced hyperlipemia 10/25/2013   MI (mitral incompetence) 10/25/2013   Episode of syncope 10/25/2013    Past Surgical History:  Procedure Laterality Date   ABDOMINAL HYSTERECTOMY  1976   tah/bso   ANTERIOR AND POSTERIOR VAGINAL REPAIR  05/12/2014   APPENDECTOMY     CEREBRAL ANEURYSM REPAIR     CYSTOCELE REPAIR N/A  08/07/2017   Procedure: ANTERIOR REPAIR (CYSTOCELE);  Surgeon: Kathe Gladis LABOR, MD;  Location: [facility];  Service: Gynecology;  Laterality: N/A;  with enterocele ligation and kelly plication   ENTEROCELE REPAIR  05/2014   KNEE ARTHROPLASTY Left 06/10/2022   Procedure: COMPUTER ASSISTED TOTAL KNEE ARTHROPLASTY;  Surgeon: Mardee Lynwood SQUIBB, MD;  Location: [facility];  Service: Orthopedics;  Laterality: Left;   KNEE ARTHROSCOPY Left 06/27/2016   Procedure: ARTHROSCOPY LEFT  KNEE, PARTIAL MEDIAL MENISECTOMY, MEDIAL CHONDROPLASTY;  Surgeon: Lynwood SQUIBB Mardee, MD;  Location: [facility];  Service: Orthopedics;  Laterality: Left;   KNEE SURGERY Right 2010    OB History   No obstetric history on file.      Home Medications    Prior to Admission medications   Medication Sig Start Date End Date Taking? Authorizing Provider  traZODone (DESYREL) 50 MG tablet Take by mouth. 04/14/23 04/13/24 Yes [provider]  aspirin 81 MG EC tablet Take by mouth. 05/09/18   [provider]  Calcium Carb-Cholecalciferol (CALCIUM 600+D) 600-20 MG-MCG TABS Take 1 tablet by mouth daily.    [provider]  celecoxib  (CELEBREX ) 200 MG capsule Take 1 capsule (200 mg total) by mouth 2 (two) times daily. 06/11/22   Verlinda Boas, PA-C  Cholecalciferol (VITAMIN D-3) 125 MCG (5000 UT) TABS Take 1 capsule by mouth daily.    [provider]  COLLAGEN PO Take 4 Doses  by mouth daily. Vital Proteins Gummies    [provider]  Cyanocobalamin (VITAMIN B-12) 5000 MCG TBDP Take 1 tablet by mouth daily.    [provider]  enoxaparin  (LOVENOX ) 40 MG/0.4ML injection Inject 0.4 mLs (40 mg total) into the skin daily for 14 days. 06/11/22 06/25/22  Verlinda Boas, PA-C  Nutritional Supplements (ADULT NUTRITIONAL SUPPLEMENT PO) Take 4 capsules by mouth daily. Nutrafol Women's Balance Hair Growth    [provider]  Omega-3 Fatty Acids (FISH OIL) 1000 MG CAPS Take 1,000 mg by mouth daily.     [provider]  oxyCODONE  (OXY IR/ROXICODONE ) 5 MG immediate release tablet Take 1 tablet (5 mg total) by mouth every 4 (four) hours as needed for moderate pain (pain score 4-6). 06/11/22   Verlinda Boas, PA-C  propranolol  ER (INDERAL  LA) 60 MG 24 hr capsule Take 60 mg by mouth daily. 05/04/21   [provider]  traMADol  (ULTRAM ) 50 MG tablet Take 1-2 tablets (50-100 mg total) by mouth every 4 (four) hours as needed for moderate pain. 06/11/22   Verlinda Boas, PA-C  Turmeric 500 MG CAPS Take 500 mg by mouth daily.    [provider]  vitamin E 1000 UNIT capsule Take 1,000 Units by mouth daily.    [provider]    Family History Family History  Problem Relation Age of Onset   Cancer Neg Hx     Social History Social History   Tobacco Use   Smoking status: Former    Current packs/day: 0.00    Average packs/day: 0.3 packs/day for 20.0 years (5.0 ttl pk-yrs)    Types: Cigarettes    Start date: 06/20/1988    Quit date: 06/20/2008    Years since quitting: 14.9   Smokeless tobacco: Never  Vaping Use   Vaping status: Never Used  Substance Use Topics   Alcohol use: Yes    Alcohol/week: 7.0 - 14.0 standard drinks of alcohol    Types: 7 - 14 Glasses of wine per week    Comment: RED WINE EVERY NIGHT   Drug use: No     Allergies   Penicillins   Review of Systems Review of Systems  HENT:  Positive for ear pain and hearing loss. Negative for ear discharge.      Physical Exam Triage Vital Signs ED Triage Vitals [06/06/23 1458]  Encounter Vitals Group     BP      Systolic BP Percentile      Diastolic BP Percentile      Pulse      Resp      Temp      Temp src      SpO2      Weight      Height      Head Circumference      Peak Flow      Pain Score 0     Pain Loc      Pain Education      Exclude from Growth Chart    No data found.  Updated Vital Signs BP 124/67 (BP Location: Right Arm)   Pulse 75   Temp 98.6 F (37 C) (Oral)   Resp 16    SpO2 95%   Visual Acuity Right Eye Distance:   Left Eye Distance:   Bilateral Distance:    Right Eye Near:   Left Eye Near:    Bilateral Near:     Physical Exam Vitals and nursing note reviewed.  Constitutional:  Appearance: Normal appearance. She is not ill-appearing.  HENT:     Head: Normocephalic and atraumatic.     Right Ear: Tympanic membrane, ear canal and external ear normal. There is no impacted cerumen.     Left Ear: Ear canal and external ear normal. There is impacted cerumen.  Skin:    General: Skin is warm and dry.     Capillary Refill: Capillary refill takes less than 2 seconds.     Findings: No rash.  Neurological:     General: No focal deficit present.     Mental Status: She is alert and oriented to person, place, and time.      UC Treatments / Results  Labs (all labs ordered are listed, but only abnormal results are displayed) Labs Reviewed - No data to display  EKG   Radiology No results found.  Procedures Procedures (including critical care time)  Medications Ordered in UC Medications - No data to display  Initial Impression / Assessment and Plan / UC Course  I have reviewed the triage vital signs and the nursing notes.  Pertinent labs & imaging results that were available during my care of the patient were reviewed by me and considered in my medical decision making (see chart for details).   Patient is a very pleasant, nontoxic-appearing 78 year old female presenting for evaluation of fullness in her left ear after using Q-tips as outlined HPI above.  Her physical exam does reveal that her left external auditory canal is occluded by cerumen.  Right EAC is patent and the right tympanic membrane is pearly gray in appearance.  I will order an ear lavage of the left ear and reassess.  Ear lavage of left ear was successful at removing the impacted cerumen.  Reevaluation reveals a patent external auditory canal and a tympanic membrane that is  intact and pearly gray in appearance.  I will discharge patient on the diagnosis of cerumen impaction and have her return for a any return of symptoms.   Final Clinical Impressions(s) / UC Diagnoses   Final diagnoses:  Impacted cerumen of left ear     Discharge Instructions      Return if your symptoms return.     ED Prescriptions   None    PDMP not reviewed this encounter.   Bernardino Ditch, NP 06/06/23 1539

## 2023-08-28 ENCOUNTER — Ambulatory Visit
Admission: RE | Admit: 2023-08-28 | Discharge: 2023-08-28 | Disposition: A | Discharge status: Home / Self Care | Source: Ambulatory Visit | Attending: Internal Medicine | Admitting: Internal Medicine

## 2023-08-28 DIAGNOSIS — Z1231 Encounter for screening mammogram for malignant neoplasm of breast: Secondary | ICD-10-CM | POA: Insufficient documentation

## 2024-04-15 ENCOUNTER — Other Ambulatory Visit: Payer: Self-pay | Admitting: Internal Medicine

## 2024-04-15 DIAGNOSIS — Z1231 Encounter for screening mammogram for malignant neoplasm of breast: Secondary | ICD-10-CM

## 2024-05-12 ENCOUNTER — Other Ambulatory Visit: Payer: Self-pay

## 2024-05-12 ENCOUNTER — Emergency Department: Admission: EM | Admit: 2024-05-12 | Discharge: 2024-05-12 | Disposition: A | Discharge status: Home / Self Care

## 2024-05-12 ENCOUNTER — Emergency Department

## 2024-05-12 DIAGNOSIS — R0981 Nasal congestion: Secondary | ICD-10-CM | POA: Diagnosis not present

## 2024-05-12 DIAGNOSIS — R002 Palpitations: Secondary | ICD-10-CM | POA: Insufficient documentation

## 2024-05-12 DIAGNOSIS — D649 Anemia, unspecified: Secondary | ICD-10-CM | POA: Diagnosis not present

## 2024-05-12 DIAGNOSIS — I10 Essential (primary) hypertension: Secondary | ICD-10-CM | POA: Diagnosis not present

## 2024-05-12 DIAGNOSIS — D72829 Elevated white blood cell count, unspecified: Secondary | ICD-10-CM | POA: Insufficient documentation

## 2024-05-12 LAB — CBC
HCT: 35.1 % — ABNORMAL LOW (ref 36.0–46.0)
Hemoglobin: 11.6 g/dL — ABNORMAL LOW (ref 12.0–15.0)
MCH: 31.4 pg (ref 26.0–34.0)
MCHC: 33 g/dL (ref 30.0–36.0)
MCV: 94.9 fL (ref 80.0–100.0)
Platelets: 330 K/uL (ref 150–400)
RBC: 3.7 MIL/uL — ABNORMAL LOW (ref 3.87–5.11)
RDW: 12.9 % (ref 11.5–15.5)
WBC: 12.1 K/uL — ABNORMAL HIGH (ref 4.0–10.5)
nRBC: 0 % (ref 0.0–0.2)

## 2024-05-12 LAB — BASIC METABOLIC PANEL WITH GFR
Anion gap: 10 (ref 5–15)
BUN: 18 mg/dL (ref 8–23)
CO2: 25 mmol/L (ref 22–32)
Calcium: 9.2 mg/dL (ref 8.9–10.3)
Chloride: 105 mmol/L (ref 98–111)
Creatinine, Ser: 0.6 mg/dL (ref 0.44–1.00)
GFR, Estimated: >60 mL/min
Glucose, Bld: 154 mg/dL — ABNORMAL HIGH (ref 70–99)
Potassium: 3.4 mmol/L — ABNORMAL LOW (ref 3.5–5.1)
Sodium: 140 mmol/L (ref 135–145)

## 2024-05-12 LAB — TROPONIN T, HIGH SENSITIVITY: Troponin T High Sensitivity: <15 ng/L (ref 0–19)

## 2024-05-12 NOTE — ED Triage Notes (Signed)
 Pt comes with irregular heartbeat. Pt states she was at home and it just started. Pt states she can feel it happen in her throat. Pt denies any cp or sob. Pt not on thinners.

## 2024-05-12 NOTE — ED Provider Notes (Signed)
 "  [facility] Provider Note    Event Date/Time   First MD Initiated Contact with Patient 05/12/24 1721     (approximate)   History   Irregular Heart Beat  Pt comes with irregular heartbeat. Pt states she was at home and it just started. Pt states she can feel it happen in her throat. Pt denies any cp or sob. Pt not on thinners.    HPI Katrina Brown is a 79 y.o. female PMH hypertension, nephrolithiasis, cerebral aneurysm, prediabetes discussed workup with his wife, depression presents for evaluation after reported irregular heartbeat - Patient notes that earlier this afternoon she started developing some mild palpitations so decided to come to emergency department for eval - No chest pain, diaphoresis, paresthesias, shortness of breath.  Has otherwise been in her usual state of health other than some mild congestion.  No abdominal pain. - Has otherwise been in her usual state of health with no other symptoms - Notes symptoms have resolved, currently asymptomatic.  No history of DVT/PE, no recent surgery/station/travel, no hormone use, no leg swelling, no pleuritic discomfort        Physical Exam   Triage Vital Signs: ED Triage Vitals  Encounter Vitals Group     BP 05/12/24 1537 (!) 157/65     Girls Systolic BP Percentile --      Girls Diastolic BP Percentile --      Boys Systolic BP Percentile --      Boys Diastolic BP Percentile --      Pulse Rate 05/12/24 1537 92     Resp 05/12/24 1537 19     Temp 05/12/24 1537 99.2 F (37.3 C)     Temp src --      SpO2 05/12/24 1537 97 %     Weight --      Height --      Head Circumference --      Peak Flow --      Pain Score 05/12/24 1536 0     Pain Loc --      Pain Education --      Exclude from Growth Chart --     Most recent vital signs: Vitals:   05/12/24 1537  BP: (!) 157/65  Pulse: 92  Resp: 19  Temp: 99.2 F (37.3 C)  SpO2: 97%     General: Awake, no distress.  CV:  Good peripheral  perfusion. RRR (HR 80s at time of my eval), RP 2+ Resp:  Normal effort. CTAB Abd:  No distention. Nontender to deep palpation throughout    ED Results / Procedures / Treatments   Labs (all labs ordered are listed, but only abnormal results are displayed) Labs Reviewed  BASIC METABOLIC PANEL WITH GFR - Abnormal; Notable for the following components:      Result Value   Potassium 3.4 (*)    Glucose, Bld 154 (*)    All other components within normal limits  CBC - Abnormal; Notable for the following components:   WBC 12.1 (*)    RBC 3.70 (*)    Hemoglobin 11.6 (*)    HCT 35.1 (*)    All other components within normal limits  TROPONIN T, HIGH SENSITIVITY     EKG  See ED course below.   RADIOLOGY Radiology interpreted by myself and radiology report reviewed.  No acute pathology identified.    PROCEDURES:  Critical Care performed: No  Procedures   MEDICATIONS ORDERED IN ED: Medications - No data to  display   IMPRESSION / MDM / ASSESSMENT AND PLAN / ED COURSE  I reviewed the triage vital signs and the nursing notes.                              DDX/MDM/AP: Differential diagnosis includes, but is not limited to, consider transient arrhythmia, consider underlying electrolyte abnormality or anemia contributing, considered anxiety as contributing factor.  Do not clinically suspect ACS at this time.  Doubt underlying pulmonary issues such as pneumothorax or pneumonia.  Plan: - Labs  -EKG - Chest x-ray Reassess   Patient's presentation is most consistent with acute presentation with potential threat to life or bodily function.  The patient is on the cardiac monitor to evaluate for evidence of arrhythmia and/or significant heart rate changes.  ED course below.  Workup unremarkable for new mild anemia --patient denies any black or bloody stools, vaginal bleeding, hematuria.  Do not believe this is contributing to palpitations.  Some frequent PVCs identified, no  significant electrolyte abnormalities and has otherwise been in her usual state of health.  In shared decision making, she would like to follow-up with her cardiologist--provided her information for outpatient follow-up with the on-call cardiologist.  Will also follow-up with her primary care provider in addition.  Can consider Holter monitor in the outpatient setting for further evaluation of possible transient arrhythmia.  Do not clinically suspect significant hormonal abnormality at this time and is otherwise hemodynamically normal and asymptomatic patient.  ED return precautions in place.  Patient agrees with plan.  Clinical Course as of 05/12/24 MAURINE Repress May 12, 2024  1806 CBC with mild leukocytosis and mild anemia  BMP with very mild hypokalemia, otherwise unremarkable  Troponin normal [MM]  1806 CXR: IMPRESSION: No active cardiopulmonary disease.   [MM]  1806 Ecg = sinus rhythm, rate 95, no gross ST elevation, some trace depressions noted in leads II and III, normal axis, normal intervals.  Frequent PVCs.  Overall appears similar from EKGs in January and February 2024. No clear e/o ischemia or arrhythmia on my interpretation.  [MM]    Clinical Course User Index [MM] Clarine Ozell LABOR, MD     FINAL CLINICAL IMPRESSION(S) / ED DIAGNOSES   Final diagnoses:  Palpitations     Rx / DC Orders   ED Discharge Orders     None        Note:  This document was prepared using Dragon voice recognition software and may include unintentional dictation errors.   Clarine Ozell LABOR, MD 05/12/24 1824  "

## 2024-05-12 NOTE — Discharge Instructions (Addendum)
 Your evaluation in the emergency department was overall reassuring.  I am unsure as to the exact cause of your palpitations, but I have provided you the contact information to follow-up with a cardiologist--please call the clinic to arrange a follow-up appointment.  You should also follow-up with your primary care provider in addition.  As discussed, your blood level was mildly lower today than prior and evidence does not require further intervention at this time, but you can have this retested when you follow-up with your primary care doctor to ensure it is not dropping further.  Return to the emergency department with any new or worsening symptoms.
